# Patient Record
Sex: Female | Born: 1955 | Race: White | Hispanic: Yes | Marital: Married | Smoking: Never smoker
Health system: Southern US, Community
[De-identification: ages and names within clinical notes are randomized; demographics above are authoritative.]

## PROBLEM LIST (undated history)

## (undated) DIAGNOSIS — Z87442 Personal history of urinary calculi: Secondary | ICD-10-CM

## (undated) DIAGNOSIS — E039 Hypothyroidism, unspecified: Secondary | ICD-10-CM

## (undated) DIAGNOSIS — E785 Hyperlipidemia, unspecified: Secondary | ICD-10-CM

## (undated) DIAGNOSIS — R32 Unspecified urinary incontinence: Secondary | ICD-10-CM

## (undated) DIAGNOSIS — E079 Disorder of thyroid, unspecified: Secondary | ICD-10-CM

## (undated) HISTORY — DX: Disorder of thyroid, unspecified: E07.9

## (undated) HISTORY — DX: Hyperlipidemia, unspecified: E78.5

## (undated) HISTORY — PX: BUNIONECTOMY: SHX129

## (undated) HISTORY — DX: Unspecified urinary incontinence: R32

## (undated) HISTORY — PX: TONSILLECTOMY: SUR1361

---

## 1997-12-18 ENCOUNTER — Other Ambulatory Visit: Admission: RE | Admit: 1997-12-18 | Discharge: 1997-12-18 | Payer: Self-pay | Admitting: Obstetrics & Gynecology

## 1998-05-13 ENCOUNTER — Other Ambulatory Visit: Admission: RE | Admit: 1998-05-13 | Discharge: 1998-05-13 | Payer: Self-pay | Admitting: Obstetrics & Gynecology

## 1999-04-07 ENCOUNTER — Other Ambulatory Visit: Admission: RE | Admit: 1999-04-07 | Discharge: 1999-04-07 | Payer: Self-pay | Admitting: Obstetrics & Gynecology

## 2000-12-16 ENCOUNTER — Other Ambulatory Visit: Admission: RE | Admit: 2000-12-16 | Discharge: 2000-12-16 | Payer: Self-pay | Admitting: Obstetrics & Gynecology

## 2001-12-27 ENCOUNTER — Other Ambulatory Visit: Admission: RE | Admit: 2001-12-27 | Discharge: 2001-12-27 | Payer: Self-pay | Admitting: Obstetrics & Gynecology

## 2003-02-15 ENCOUNTER — Other Ambulatory Visit: Admission: RE | Admit: 2003-02-15 | Discharge: 2003-02-15 | Payer: Self-pay | Admitting: Obstetrics & Gynecology

## 2003-02-18 ENCOUNTER — Other Ambulatory Visit: Admission: RE | Admit: 2003-02-18 | Discharge: 2003-02-18 | Payer: Self-pay | Admitting: Obstetrics & Gynecology

## 2004-04-21 ENCOUNTER — Other Ambulatory Visit: Admission: RE | Admit: 2004-04-21 | Discharge: 2004-04-21 | Payer: Self-pay | Admitting: Obstetrics & Gynecology

## 2008-08-24 ENCOUNTER — Emergency Department (HOSPITAL_COMMUNITY): Admission: EM | Admit: 2008-08-24 | Discharge: 2008-08-24 | Payer: Self-pay | Admitting: Emergency Medicine

## 2008-12-10 ENCOUNTER — Encounter: Admission: RE | Admit: 2008-12-10 | Discharge: 2008-12-10 | Payer: Self-pay | Admitting: Obstetrics & Gynecology

## 2010-05-10 LAB — POCT I-STAT, CHEM 8
Chloride: 106 mEq/L (ref 96–112)
HCT: 44 % (ref 36.0–46.0)
Hemoglobin: 15 g/dL (ref 12.0–15.0)
Potassium: 4.1 mEq/L (ref 3.5–5.1)
Sodium: 141 mEq/L (ref 135–145)

## 2010-05-10 LAB — URINALYSIS, ROUTINE W REFLEX MICROSCOPIC
Bilirubin Urine: NEGATIVE
Glucose, UA: NEGATIVE mg/dL
Ketones, ur: NEGATIVE mg/dL
pH: 5.5 (ref 5.0–8.0)

## 2010-05-10 LAB — URINE MICROSCOPIC-ADD ON

## 2013-12-06 ENCOUNTER — Other Ambulatory Visit (HOSPITAL_COMMUNITY): Payer: Self-pay | Admitting: Obstetrics & Gynecology

## 2013-12-06 DIAGNOSIS — Z780 Asymptomatic menopausal state: Secondary | ICD-10-CM

## 2013-12-12 ENCOUNTER — Ambulatory Visit (HOSPITAL_COMMUNITY)
Admission: RE | Admit: 2013-12-12 | Discharge: 2013-12-12 | Disposition: A | Discharge status: Home / Self Care | Payer: BC Managed Care – PPO | Source: Ambulatory Visit | Attending: Obstetrics & Gynecology | Admitting: Obstetrics & Gynecology

## 2013-12-12 DIAGNOSIS — Z780 Asymptomatic menopausal state: Secondary | ICD-10-CM | POA: Diagnosis not present

## 2013-12-12 DIAGNOSIS — Z13820 Encounter for screening for osteoporosis: Secondary | ICD-10-CM | POA: Diagnosis present

## 2016-02-02 DIAGNOSIS — E785 Hyperlipidemia, unspecified: Secondary | ICD-10-CM

## 2016-02-02 HISTORY — DX: Hyperlipidemia, unspecified: E78.5

## 2016-08-11 ENCOUNTER — Telehealth: Payer: Self-pay | Admitting: Obstetrics and Gynecology

## 2016-08-11 NOTE — Telephone Encounter (Signed)
Called and left a message for patient to call back to schedule a new patient doctor referral for prolapse with Dr. Quincy Simmonds.

## 2016-08-13 ENCOUNTER — Encounter: Payer: Self-pay | Admitting: Obstetrics and Gynecology

## 2016-08-13 ENCOUNTER — Ambulatory Visit (INDEPENDENT_AMBULATORY_CARE_PROVIDER_SITE_OTHER): Payer: BC Managed Care – PPO | Admitting: Obstetrics and Gynecology

## 2016-08-13 VITALS — BP 138/72 | HR 72 | Resp 16 | Ht 61.75 in | Wt 179.0 lb

## 2016-08-13 DIAGNOSIS — N813 Complete uterovaginal prolapse: Secondary | ICD-10-CM

## 2016-08-13 DIAGNOSIS — N393 Stress incontinence (female) (male): Secondary | ICD-10-CM

## 2016-08-13 NOTE — Progress Notes (Signed)
GYNECOLOGY  VISIT   HPI: 61 y.o.   Married  Caucasian  female   G3P0003 with No LMP recorded. Patient is postmenopausal.   here for prolapse and pelvic floor.  Recently had some pink tinge twice with wiping.   Has personal [place] with Dr. Stann Mainland, and EMS was 3.6 mm.  No EMB was done.   Also dealing with prolapse.  Can feel it descending.   Prolapse interferes with voiding.   Incontinence is irritating to the skin.  Prolapse is making the leakage better.  Can leak with cough, sneeze, or laugh.   Can have fecal incontinence with a loose stool or cough.  No splinting to have BMs.  Sexually active and this is bothersome with intercourse.   Hx urethral dilation for UTIs.   Hx cystitis as an adult.  No pyelonephritis.  No hematuria unless had infection.  Hx renal stone.  Passed spontaneously.   Night time urination once per night.  DF - every 2 hours.   Did PT in the past.   3 vaginal deliveries.  Largest was 11 pounds.  One forceps and one vacuum.   GYNECOLOGIC HISTORY: No LMP recorded. Patient is postmenopausal. Contraception:  Postmenopausal/condoms Menopausal hormone therapy:  none Last mammogram:  09/01/2015 done at Dr. Gwynne Edinger office Last pap smear:   09/01/15 -- WNL        OB History    Gravida Para Term Preterm AB Living   3 3 0 0 0 3   SAB TAB Ectopic Multiple Live Births   0 0 0 0 0         There are no active problems to display for this patient.   Past Medical History:  Diagnosis Date  . Thyroid disease   . Urinary incontinence     Past Surgical History:  Procedure Laterality Date  . BUNIONECTOMY    . TONSILLECTOMY      Current Outpatient Prescriptions  Medication Sig Dispense Refill  . CALCIUM PO Take by mouth.    . dicyclomine (BENTYL) 20 MG tablet as needed.    Marland Kitchen levothyroxine (SYNTHROID) 75 MCG tablet Take 1 tablet by mouth daily.    . Multiple Vitamin (MULTIVITAMIN) tablet Take 1 tablet by mouth daily.     No current  facility-administered medications for this visit.      ALLERGIES: Nsaids  Family History  Problem Relation Age of Onset  . Multiple sclerosis Mother   . Liver cancer Maternal Grandmother   . Heart attack Maternal Grandfather   . Hypertension Maternal Grandfather     Social History   Social History  . Marital status: Married    Spouse name: N/A  . Number of children: N/A  . Years of education: N/A   Occupational History  . Not on file.   Social History Main Topics  . Smoking status: Never Smoker  . Smokeless tobacco: Never Used  . Alcohol use 0.6 oz/week    1 Glasses of wine per week     Comment: occasional  . Drug use: No  . Sexual activity: Yes    Birth control/ protection: Post-menopausal, Condom   Other Topics Concern  . Not on file   Social History Narrative  . No narrative on file    ROS:  Pertinent items are noted in HPI.  PHYSICAL EXAMINATION:    BP 138/72 (BP Location: Right Arm, Patient Position: Sitting, Cuff Size: Normal)   Pulse 72   Resp 16   Ht 5' 1.75" (1.568  m)   Wt 179 lb (81.2 kg)   BMI 33.01 kg/m     General appearance: alert, cooperative and appears stated age Head: Normocephalic, without obvious abnormality, atraumatic Lungs: clear to auscultation bilaterally Heart: regular rate and rhythm Abdomen: soft, non-tender, no masses,  no organomegaly Extremities: extremities normal, atraumatic, no cyanosis or edema No abnormal inguinal nodes palpated Neurologic: Grossly normal  Pelvic: External genitalia:  no lesions              Urethra:  normal appearing urethra with no masses, tenderness or lesions              Bartholins and Skenes: normal                 Vagina: normal appearing vagina with normal color and discharge, no lesions.  Third degree uterine prolapse.  First degree cystocele.  First degree rectocele.               Cervix: no lesions                Bimanual Exam:  Uterus:  normal size, contour, position, consistency,  mobility, non-tender              Adnexa: no mass, fullness, tenderness              Rectal exam: Yes.  .  Confirms.              Anus:  normal sphincter tone, no lesions.  Good tone.   Chaperone was present for exam.  ASSESSMENT  Third degree uterine prolapse.  Stress incontinence.  PLAN  Comprehensive discussion of prolapse and urinary incontinence - etiologies and treatment options.  We discussed observational management, pessary care, physical therapy, and surgical management.  I reviewed that surgery is best approached comprehensively with the amount of prolapse she has.  I would recommend a hysterectomy and vaginal apical prolapse repair, which can be performed vaginally, abdominally, or laparoscopically.  She will also likely need an anterior and posterior colporrhaphy with anti-incontinence procedure.  Urodynamic testing with reduction of the prolapse is recommended presurgery.  Will schedule this.  ACOG handouts on prolapse and incontinence and surgical care for these conditions were provided to the patient.  Patient wishes to proceed forward with surgical care including a sacrocolpopexy likely robotically.  Patient is aware that I currently perform this procedure through an open incision and that I am beginning to perform it laparoscopically with the robot.   An After Visit Summary was printed and given to the patient.  ___30___ minutes face to face time of which over 50% was spent in counseling.

## 2016-08-23 ENCOUNTER — Telehealth: Payer: Self-pay | Admitting: Obstetrics and Gynecology

## 2016-08-23 NOTE — Telephone Encounter (Signed)
Patient is asking ready to schedule her procedure.

## 2016-08-23 NOTE — Telephone Encounter (Signed)
Return call to patient regarding scheduling urodynamics testing. Left message to call back.

## 2016-08-23 NOTE — Telephone Encounter (Signed)
Spoke with patient, requesting to schedule Urodynamics testing. States she spoke with [place] last week. Would also like to know how far out after Urodynamics testing will surgery be scheduled for? Advised patient would forward message to [facility] who will assist with scheduling and additional questions in regards to surgery. Patient verbalizes understanding and is agreeable.  Routing to Lamont Snowball, Therapist, sports.  Cc: Dr. Quincy Simmonds

## 2016-08-24 NOTE — Telephone Encounter (Signed)
Patient returning your call.

## 2016-08-24 NOTE — Telephone Encounter (Signed)
Spoke with patient. Urodynamics bladder testing form reviewed with patient. Urine check scheduled for 09/03/2016 at 2:30 pm. Urodynamics scheduled for 09/08/2016 at 2 pm. Follow up with Dr.Silva scheduled for 09/15/16 at 2:30 pm. Patient is agreeable to all appointment dates and times. Aware she will need to arrive to urodynamics appointment with a full bladder. Urodynamics bladder testing form mailed to patient's verified address on file.  Cc: Lamont Snowball, RN  Routing to provider for final review. Patient agreeable to disposition. Will close encounter.

## 2016-08-24 NOTE — Telephone Encounter (Signed)
Return call to patient. Left message to call back. Can speak to Triage nurse regarding scheduling. Next available date 09-08-16 at 930am  or 2pm.

## 2016-08-24 NOTE — Telephone Encounter (Signed)
Patient is returning a call top Gay Filler. Please use the home phone number.

## 2016-09-03 ENCOUNTER — Ambulatory Visit (INDEPENDENT_AMBULATORY_CARE_PROVIDER_SITE_OTHER): Payer: BC Managed Care – PPO

## 2016-09-03 VITALS — BP 138/70 | HR 64 | Resp 16 | Wt 179.0 lb

## 2016-09-03 DIAGNOSIS — R8290 Unspecified abnormal findings in urine: Secondary | ICD-10-CM | POA: Diagnosis not present

## 2016-09-03 DIAGNOSIS — Z01812 Encounter for preprocedural laboratory examination: Secondary | ICD-10-CM | POA: Diagnosis not present

## 2016-09-03 LAB — POCT URINALYSIS DIPSTICK
BILIRUBIN UA: NEGATIVE
GLUCOSE UA: NEGATIVE
Ketones, UA: NEGATIVE
NITRITE UA: NEGATIVE
PH UA: 5 (ref 5.0–8.0)
Protein, UA: NEGATIVE
UROBILINOGEN UA: 0.2 U/dL

## 2016-09-03 NOTE — Progress Notes (Signed)
Patient in office for a urine dipstick for Urodynamics. Urine sample given and tested. Dipstick showed a trace of RBC and 3+ WBC. Per patient has not had any symptoms. Advised patient that urine will be sent for further analysis (urine culture) and someone from our office will contact her Monday. Culture drawn up and sent to lab.  Routing to provider for final review. Patient agreeable to disposition. Will close encounter.

## 2016-09-05 LAB — URINE CULTURE

## 2016-09-06 ENCOUNTER — Telehealth: Payer: Self-pay | Admitting: Obstetrics and Gynecology

## 2016-09-06 ENCOUNTER — Telehealth: Payer: Self-pay

## 2016-09-06 MED ORDER — FLUCONAZOLE 150 MG PO TABS: 150.0000 mg | ORAL_TABLET | Freq: Once | ORAL | 0 refills | Status: AC

## 2016-09-06 MED ORDER — AMPICILLIN 500 MG PO CAPS: 500.0000 mg | ORAL_CAPSULE | Freq: Four times a day (QID) | ORAL | 0 refills | Status: DC

## 2016-09-06 NOTE — Telephone Encounter (Signed)
Spoke with patient and notified of message below. Rxs sent for Ampicilin 500mg  #28NR, 1po qid and Diflucan 150mg  #2NR, 1po prn yeast and may repeat in 72 hours. Will have Gay Filler call to reschedule urodynamic testing on 09-08-16.

## 2016-09-06 NOTE — Telephone Encounter (Signed)
Patient called requesting to reschedule her urinalysis and urodynamics appointments. Appointments cancelled and routing to [place], per [place].

## 2016-09-06 NOTE — Telephone Encounter (Signed)
-----   Message from Nunzio Cobbs, MD sent at 09/05/2016 11:05 AM EDT ----- Please report final urine culture showing group B strep. I am recommending treatment with ampicillin 500 mg po qid for 7 days.  Dispense:  28, RF none.  She may also have Dilfucan 150 gm po x 1 prn yeast infection.  May repeat in 72 hours if needed.   Dispense:  2, RF none.  She will need to have her urodynamic testing rescheduled.  Cc- Lamont Snowball

## 2016-09-07 NOTE — Telephone Encounter (Signed)
Call to patient. Urodynamic testing rescheduled to 09-22-16 pending urine TOC on 09-16-16.  Patient very anxious to plan surgery date. Date options discussed. Tentative plan for 10-19-16 at [facility] hospital. Patient advised this is pending urodynamic testing results and consult with Dr Quincy Simmonds.  Patient advised Dr Quincy Simmonds will review call and confirm if this is acceptable option and we will call her back.   10-19-16/ Women's/robot available/[facility] assist/no time limitation. Please advise.

## 2016-09-08 ENCOUNTER — Ambulatory Visit: Payer: BC Managed Care – PPO

## 2016-09-08 ENCOUNTER — Telehealth: Payer: Self-pay | Admitting: Obstetrics and Gynecology

## 2016-09-08 NOTE — Telephone Encounter (Signed)
I will need to do some coordination of the date for the surgery if it is to be done robotically.  If it is to be done through laparotomy, I can confirm this date of 10/19/16.

## 2016-09-08 NOTE — Telephone Encounter (Signed)
Great question. The ampicillin is a very specific antibiotic for the type of bacterial organism which grew in her urine.  This is a proper antibiotic for group B streptococcus organisms.

## 2016-09-08 NOTE — Telephone Encounter (Signed)
Patient called requesting to speak with the nurse about the medication she is taking for a urinary tract infection. She wants to be sure we are coordinating her with Dr. Stann Mainland who referred her. She said he's tried several different medications for her urinary tract infections in the past and she wants to be sure she is on the right medication.

## 2016-09-08 NOTE — Telephone Encounter (Signed)
Dr. Quincy Simmonds, see patient message below and advise.

## 2016-09-09 NOTE — Telephone Encounter (Signed)
Spoke with patient, advised as seen below per Dr. Quincy Simmonds. Patient thankful for f/u and clarification, verbalizes understanding and is agreeable.  Patient is agreeable to disposition. Will close encounter.

## 2016-09-10 NOTE — Telephone Encounter (Signed)
Surgical options to be discussed at appointment scheduled for 09-24-16. Encounter closed.

## 2016-09-15 ENCOUNTER — Ambulatory Visit: Payer: BC Managed Care – PPO | Admitting: Obstetrics and Gynecology

## 2016-09-16 ENCOUNTER — Ambulatory Visit (INDEPENDENT_AMBULATORY_CARE_PROVIDER_SITE_OTHER): Payer: BC Managed Care – PPO | Admitting: Obstetrics and Gynecology

## 2016-09-16 VITALS — BP 136/80 | HR 76 | Resp 18 | Ht 61.75 in | Wt 179.0 lb

## 2016-09-16 DIAGNOSIS — R3989 Other symptoms and signs involving the genitourinary system: Secondary | ICD-10-CM | POA: Diagnosis not present

## 2016-09-16 DIAGNOSIS — R82998 Other abnormal findings in urine: Secondary | ICD-10-CM

## 2016-09-16 DIAGNOSIS — N393 Stress incontinence (female) (male): Secondary | ICD-10-CM

## 2016-09-16 DIAGNOSIS — R8299 Other abnormal findings in urine: Secondary | ICD-10-CM

## 2016-09-16 LAB — POCT URINALYSIS DIPSTICK
BILIRUBIN UA: NEGATIVE
Glucose, UA: NEGATIVE
Ketones, UA: NEGATIVE
NITRITE UA: NEGATIVE
PH UA: 5 (ref 5.0–8.0)
Protein, UA: NEGATIVE
RBC UA: NEGATIVE
UROBILINOGEN UA: 0.2 U/dL

## 2016-09-16 MED ORDER — SULFAMETHOXAZOLE-TRIMETHOPRIM 800-160 MG PO TABS: 1.0000 | ORAL_TABLET | Freq: Two times a day (BID) | ORAL | 0 refills | Status: DC

## 2016-09-16 NOTE — Progress Notes (Signed)
GYNECOLOGY  VISIT   HPI: 61 y.o.   Married  Caucasian  female   G3P0003 with No LMP recorded. Patient is postmenopausal.   here for   Urine recheck prior to urodynamics.   She has stress incontinence and complete uterovaginal prophylaxis.   Feels bladder discomfort and wants treatment.  No dysuria.  No fevers, nausea, vomiting or back pain.   Little discharge and no itching.  Took Diflucan x 2.   Drinking cranberry juice and water to hydrate.   Recent UC on 09/03/16 > 100,000 GBS treated with Ampicillin 500 mg po qid x 5 days.  States hx of UTIs.   Urine dip - 2+ WBCs.  GYNECOLOGIC HISTORY: No LMP recorded. Patient is postmenopausal. Contraception:  Post menopausal  Menopausal hormone therapy:  none Last mammogram:  09/01/15  Last pap smear:   09/01/15 Neg         OB History    Gravida Para Term Preterm AB Living   3 3 0 0 0 3   SAB TAB Ectopic Multiple Live Births   0 0 0 0 0         There are no active problems to display for this patient.   Past Medical History:  Diagnosis Date  . Thyroid disease   . Urinary incontinence     Past Surgical History:  Procedure Laterality Date  . BUNIONECTOMY    . TONSILLECTOMY      Current Outpatient Prescriptions  Medication Sig Dispense Refill  . ampicillin (PRINCIPEN) 500 MG capsule Take 1 capsule (500 mg total) by mouth 4 (four) times daily. 28 capsule 0  . CALCIUM PO Take by mouth.    . dicyclomine (BENTYL) 20 MG tablet as needed.    Marland Kitchen levothyroxine (SYNTHROID) 75 MCG tablet Take 1 tablet by mouth daily.    . Multiple Vitamin (MULTIVITAMIN) tablet Take 1 tablet by mouth daily.     No current facility-administered medications for this visit.      ALLERGIES: Nsaids  Family History  Problem Relation Age of Onset  . Multiple sclerosis Mother   . Liver cancer Maternal Grandmother   . Heart attack Maternal Grandfather   . Hypertension Maternal Grandfather     Social History   Social History  . Marital status:  Married    Spouse name: N/A  . Number of children: N/A  . Years of education: N/A   Occupational History  . Not on file.   Social History Main Topics  . Smoking status: Never Smoker  . Smokeless tobacco: Never Used  . Alcohol use 0.6 oz/week    1 Glasses of wine per week     Comment: occasional  . Drug use: No  . Sexual activity: Yes    Birth control/ protection: Post-menopausal, Condom   Other Topics Concern  . Not on file   Social History Narrative  . No narrative on file    ROS:  Pertinent items are noted in HPI.  PHYSICAL EXAMINATION:    BP 136/80 (BP Location: Right Arm, Patient Position: Sitting, Cuff Size: Large)   Pulse 76   Resp 18   Ht 5' 1.75" (1.568 m)   Wt 179 lb (81.2 kg)   BMI 33.01 kg/m     General appearance: alert, cooperative and appears stated age  ASSESSMENT  Recent GBS UTI.  Bladder discomfort.  Abnormal urine. Complete uterovaginal prolapse. Stress incontinence.   PLAN  Urine cx sent.  Start Bactrim DS po bid x 1 week.  Patient may need to have prophylaxis of abs to complete her testing.  Will not cancel urodynamics until after her final UC is back.    An After Visit Summary was printed and given to the patient.  __15___ minutes face to face time of which over 50% was spent in counseling.

## 2016-09-16 NOTE — Patient Instructions (Signed)

## 2016-09-16 NOTE — Progress Notes (Signed)
Patient in for UA prior urodynamics. Patient states she is having increased urinary frequency and some discharge. She would like to get abx before the weekend bc she is going on vacation.   UA: WBC=Moderate (++)

## 2016-09-17 LAB — URINALYSIS, MICROSCOPIC ONLY: Casts: NONE SEEN /lpf

## 2016-09-18 ENCOUNTER — Encounter: Payer: Self-pay | Admitting: Obstetrics and Gynecology

## 2016-09-18 LAB — URINE CULTURE

## 2016-09-20 ENCOUNTER — Telehealth: Payer: Self-pay | Admitting: Obstetrics and Gynecology

## 2016-09-20 MED ORDER — CEPHALEXIN 500 MG PO CAPS: 500.0000 mg | ORAL_CAPSULE | Freq: Two times a day (BID) | ORAL | 0 refills | Status: DC

## 2016-09-20 NOTE — Telephone Encounter (Signed)
Patient being treated for UTI and still having symptoms.  States she has an appt on Wednesday

## 2016-09-20 NOTE — Telephone Encounter (Signed)
Spoke with patient. Advised of message as seen below from [place]. Patient verbalizes understanding. States she is having burning with urination and blood in her urine. Advised she will need to switch to taking Keflex 500 mg BID x 7 days. Patient is agreeable. Rx sent to pharmacy on file. Requesting TOC. Appointment scheduled for 09/30/16 at 8:30 am. Advised she will be contacted upon Encarnacion Chu return to office to reschedule urodynamics. Patient is agreeable.  Notes recorded by Nunzio Cobbs, MD on 09/19/2016 at 7:13 PM EDT Please contact the patient to see how she is doing.  She reported bladder discomfort but not dysuria when I saw her in the office for her pre-urodynamic testing.  She had a recent GBS UTI that I treated with Ampicillin.  This time, I treated with Bactrim while awaiting this culture result which shows a small amount of Group B Strep. If she is having any discomfort, I would like to treat her with Keflex 500 mg po bid for one week.  If her urine culture then becomes negative, I would like to continue her on daily prophylaxis of Keflex 250 mg daily. This may be necessary until surgery is done.  Her urodynamics will need to be rescheduled.  CC: Lamont Snowball, RN  Routing to provider for final review. Patient agreeable to disposition. Will close encounter.

## 2016-09-22 ENCOUNTER — Ambulatory Visit: Payer: BC Managed Care – PPO

## 2016-09-24 ENCOUNTER — Ambulatory Visit: Payer: BC Managed Care – PPO | Admitting: Obstetrics and Gynecology

## 2016-09-30 ENCOUNTER — Ambulatory Visit (INDEPENDENT_AMBULATORY_CARE_PROVIDER_SITE_OTHER): Payer: BC Managed Care – PPO | Admitting: *Deleted

## 2016-09-30 VITALS — BP 140/86 | HR 76 | Resp 16 | Ht 61.75 in | Wt 179.0 lb

## 2016-09-30 DIAGNOSIS — N390 Urinary tract infection, site not specified: Secondary | ICD-10-CM | POA: Diagnosis not present

## 2016-09-30 DIAGNOSIS — R319 Hematuria, unspecified: Secondary | ICD-10-CM | POA: Diagnosis not present

## 2016-09-30 NOTE — Progress Notes (Signed)
Patient in today for UA TOC. Patient states she feels much better and denies any symptoms.  Informed patient we will call her with UC results and advise about urodynamics then.  UC sent.  Routed to Dr. Quincy Simmonds for review Encounter closed.

## 2016-10-04 ENCOUNTER — Telehealth: Payer: Self-pay | Admitting: Obstetrics and Gynecology

## 2016-10-04 LAB — URINE CULTURE

## 2016-10-04 NOTE — Telephone Encounter (Signed)
Left message to return to call to my personal cell phone as it is Labor Day.  No details left.   Patient has a [place] UTI that needs treatment.  It is sensitive to Bactrim, Macrobid, and Cipro.  She has uterovaginal prolapse and is having recurrent bacteria in her urine.   I am recommending Macrobid 100 mg po bid for one week, and then immediate Macrobid 100 mg po q hs for prophylaxis.  Any further urine cultures need to be done with a catheter specimen of urine.   We are trying to get her urodynamic testing done when her bladder is clear of infection.

## 2016-10-05 MED ORDER — NITROFURANTOIN MONOHYD MACRO 100 MG PO CAPS: ORAL_CAPSULE | ORAL | 0 refills | Status: DC

## 2016-10-05 NOTE — Telephone Encounter (Signed)
Spoke with patient, advised as seen below per Dr. Quincy Simmonds. Rx for Macrobid #35/0RF to verified pharmacy on file. Scheduled for f/u OV with Dr. Quincy Simmonds on 10/19/16 at 1pm. Patient verbalizes understanding and is agreeable.  Patient is agreeable to disposition. Will close encounter.   Cc: Lamont Snowball, RN

## 2016-10-05 NOTE — Telephone Encounter (Signed)
Left message to call Andrell Tallman at [phone].  

## 2016-10-05 NOTE — Telephone Encounter (Signed)
Patient returned call requesting to speak with the nurse.  [facility] on [address]

## 2016-10-13 ENCOUNTER — Telehealth: Payer: Self-pay | Admitting: Obstetrics and Gynecology

## 2016-10-13 NOTE — Telephone Encounter (Signed)
Spoke with patient. Advised to keep appointment with Dr. Quincy Simmonds for 9/18, will discuss plan further at this OV. Urodynamics scheduling will likely depend on urine culture results. Tentatively planning 10/3 for next  urodynamics, again depending on results and recommendations per Dr. Quincy Simmonds. Advised patient Dr. Quincy Simmonds is out of the office, will review with covering provider and return call with any additional recommendations.   Patient would like to discuss recovery time given surgery will be at a later date than initially planned. Has some upcoming commitments that she is concerned may interfere with recovery.   Routing to covering provider for final review. Patient is agreeable to disposition. Will close encounter.   Cc: Dr. Quincy Simmonds

## 2016-10-13 NOTE — Telephone Encounter (Signed)
I'm not sure exactly what the surgical plan is. Will need to defer the answers to the patient's questions to when Dr Quincy Simmonds returns. Will cc the chart to Lorella Nimrod, RN to see if she can provide the patient with some more information.

## 2016-10-13 NOTE — Telephone Encounter (Signed)
Patient is coming in for urine toc on the 18th and want to know if she can schedule urodynamics for the following week.

## 2016-10-14 ENCOUNTER — Telehealth: Payer: Self-pay | Admitting: Obstetrics and Gynecology

## 2016-10-14 ENCOUNTER — Encounter: Payer: Self-pay | Admitting: Obstetrics and Gynecology

## 2016-10-14 ENCOUNTER — Ambulatory Visit (INDEPENDENT_AMBULATORY_CARE_PROVIDER_SITE_OTHER): Payer: BC Managed Care – PPO | Admitting: Obstetrics and Gynecology

## 2016-10-14 VITALS — BP 130/78 | HR 68 | Resp 16 | Wt 179.0 lb

## 2016-10-14 DIAGNOSIS — Z87440 Personal history of urinary (tract) infections: Secondary | ICD-10-CM | POA: Diagnosis not present

## 2016-10-14 DIAGNOSIS — N760 Acute vaginitis: Secondary | ICD-10-CM

## 2016-10-14 LAB — POCT URINALYSIS DIPSTICK
BILIRUBIN UA: NEGATIVE
GLUCOSE UA: NEGATIVE
KETONES UA: NEGATIVE
NITRITE UA: NEGATIVE
PH UA: 6 (ref 5.0–8.0)
Protein, UA: NEGATIVE
Urobilinogen, UA: NEGATIVE E.U./dL — AB

## 2016-10-14 NOTE — Telephone Encounter (Signed)
Patient thinks she has a yeast infection and is requesting something be called into her pharmacy.  I attempted to schedule patient an appointment and she declined.

## 2016-10-14 NOTE — Telephone Encounter (Signed)
Spoke with patient. Patient reports green vaginal discharge and itching, requesting RX for yeast. Current taking Macrobid nightly for prophylaxis, scheduled for repeat urine culture on 9/18. Advised patient OV needed for further evaluation.  Advise patient Dr. Quincy Simmonds is out of the office, can schedule with covering provider. Patient initially declined OV for today d/t another appointment. Advised patient our office will be closed tomorrow d/t weather. Patient scheduled for OV today at 10am with Dr. Talbert Nan. Patient verbalizes understanding and is agreeable.   Routing to covering provider for final review. Patient is agreeable to disposition. Will close encounter.  Cc: Dr. Quincy Simmonds

## 2016-10-14 NOTE — Progress Notes (Signed)
GYNECOLOGY  VISIT   HPI: 61 y.o.   Married  Caucasian  female   G3P0003 with No LMP recorded. Patient is postmenopausal.   here for vaginal itching and discharge X 2 days . She is seeing Dr Quincy Simmonds for prolapse, she has had a difficult to treat UTI. She has taken several different antibiotics. She is currently on prophylactic macrobid. She feels her urinary symptoms are better. This morning she noticed some green discharge, mild itching.   GYNECOLOGIC HISTORY: No LMP recorded. Patient is postmenopausal. Contraception: post menopausal  Menopausal hormone therapy: none        OB History    Gravida Para Term Preterm AB Living   3 3 0 0 0 3   SAB TAB Ectopic Multiple Live Births   0 0 0 0 0         There are no active problems to display for this patient.   Past Medical History:  Diagnosis Date  . Thyroid disease   . Urinary incontinence     Past Surgical History:  Procedure Laterality Date  . BUNIONECTOMY    . TONSILLECTOMY      Current Outpatient Prescriptions  Medication Sig Dispense Refill  . CALCIUM PO Take by mouth.    . dicyclomine (BENTYL) 20 MG tablet as needed.    Marland Kitchen levothyroxine (SYNTHROID) 75 MCG tablet Take 1 tablet by mouth daily.    . Multiple Vitamin (MULTIVITAMIN) tablet Take 1 tablet by mouth daily.     No current facility-administered medications for this visit.      ALLERGIES: Nsaids  Family History  Problem Relation Age of Onset  . Multiple sclerosis Mother   . Liver cancer Maternal Grandmother   . Heart attack Maternal Grandfather   . Hypertension Maternal Grandfather     Social History   Social History  . Marital status: Married    Spouse name: N/A  . Number of children: N/A  . Years of education: N/A   Occupational History  . Not on file.   Social History Main Topics  . Smoking status: Never Smoker  . Smokeless tobacco: Never Used  . Alcohol use 0.6 oz/week    1 Glasses of wine per week     Comment: occasional  . Drug use: No   . Sexual activity: Yes    Birth control/ protection: Post-menopausal, Condom   Other Topics Concern  . Not on file   Social History Narrative  . No narrative on file    Review of Systems  Constitutional: Negative.   HENT: Negative.   Eyes: Negative.   Respiratory: Negative.   Cardiovascular: Negative.   Gastrointestinal: Negative.   Genitourinary:       Vaginal itching and discharge   Musculoskeletal: Negative.   Skin: Negative.   Neurological: Negative.   Endo/Heme/Allergies: Negative.   Psychiatric/Behavioral: Negative.     PHYSICAL EXAMINATION:    BP 130/78 (BP Location: Right Arm, Patient Position: Sitting, Cuff Size: Normal)   Pulse 68   Resp 16   Wt 179 lb (81.2 kg)   BMI 33.01 kg/m     General appearance: alert, cooperative and appears stated age  Pelvic: External genitalia:  no lesions              Urethra:  normal appearing urethra with no masses, tenderness or lesions              Bartholins and Skenes: normal  Vagina: normal appearing vagina with normal color and discharge, no lesions              Cervix: no lesions, at introitus.  Chaperone was present for exam.  Wet prep: ? clue, no trich, few wbc KOH: no yeast PH: 5  Urine dip: small leuk, trace blood  ASSESSMENT Mild vaginitis symptoms, tolerable Recurrent UTI's on antibiotics, on suppression. Currently feeling better. She has an appointment for a straight cath ua with Dr Quincy Simmonds for next week    PLAN Send vaginitis panel Send ccua for ua, c&s, if negative for infection can forgo the catheterization next week Will use Vaseline externally as needed    An After Visit Summary was printed and given to the patient.   CC: Dr Quincy Simmonds

## 2016-10-15 LAB — VAGINITIS/VAGINOSIS, DNA PROBE
Candida Species: NEGATIVE
Gardnerella vaginalis: NEGATIVE
Trichomonas vaginosis: NEGATIVE

## 2016-10-15 LAB — URINALYSIS, MICROSCOPIC ONLY: CASTS: NONE SEEN /LPF

## 2016-10-16 LAB — URINE CULTURE

## 2016-10-18 ENCOUNTER — Telehealth: Payer: Self-pay | Admitting: *Deleted

## 2016-10-18 NOTE — Telephone Encounter (Signed)
-----   Message from Salvadore Dom, MD sent at 10/18/2016  7:36 AM EDT ----- This patient has recurrent UTI's. When she was seen 4 days ago, she was on prophylactic macrobid and was not symptomatic. This could be a contaminant. She was due to come in for a straight cath ua with Dr Quincy Simmonds (I think tomorrow). I would have her come in for the straight cath and hold on treating as long as she is still asymptomatic.  CC: Dr Quincy Simmonds

## 2016-10-18 NOTE — Telephone Encounter (Signed)
Spoke with patient and gave recommendations- patient will keep appointment for tomorrow with Dr. Quincy Simmonds -eh

## 2016-10-18 NOTE — Telephone Encounter (Signed)
Left message to call regarding results -eh 

## 2016-10-19 ENCOUNTER — Ambulatory Visit (INDEPENDENT_AMBULATORY_CARE_PROVIDER_SITE_OTHER): Payer: BC Managed Care – PPO | Admitting: Obstetrics and Gynecology

## 2016-10-19 ENCOUNTER — Encounter: Payer: Self-pay | Admitting: Obstetrics and Gynecology

## 2016-10-19 VITALS — BP 130/72 | HR 80 | Resp 16 | Wt 179.0 lb

## 2016-10-19 DIAGNOSIS — N390 Urinary tract infection, site not specified: Secondary | ICD-10-CM

## 2016-10-19 DIAGNOSIS — N813 Complete uterovaginal prolapse: Secondary | ICD-10-CM | POA: Diagnosis not present

## 2016-10-19 DIAGNOSIS — R8290 Unspecified abnormal findings in urine: Secondary | ICD-10-CM | POA: Diagnosis not present

## 2016-10-19 MED ORDER — NITROFURANTOIN MONOHYD MACRO 100 MG PO CAPS: 100.0000 mg | ORAL_CAPSULE | Freq: Every day | ORAL | 0 refills | Status: DC

## 2016-10-19 NOTE — Progress Notes (Addendum)
GYNECOLOGY  VISIT   HPI: 61 y.o.   Married  Caucasian  female   G3P0003 with No LMP recorded. Patient is postmenopausal.   here for TOC UTI.   Reporting urgency today.   Patient is currently on Macrobid 100 mg po daily for prophylaxis.  Has about 2 weeks worth left.  Last UC 10/14/16 showed 10,000 - 25, 000 GBS.  Prior UC 09/30/16 showed 25,000 - 50,000 Klebsiella and enterococcus 50,000 - 100,000.  Both were sensitive to and treated with Macrobid.   Patient has complete uterovaginal prolapse and is awaiting a negative UC to do urodynamic testing with reduction of her prolapse.   She is planning on future surgery for prolapse and stress incontinence.   Her urinary incontinence occurred prior to progression of the prolapse and has gotten better with increased prolapse.   Has some potential work responsibility in December.   Urine dip today - negative.   GYNECOLOGIC HISTORY: No LMP recorded. Patient is postmenopausal. Contraception:  Postmenopausal Menopausal hormone therapy:  none Last mammogram: will schedule with Dr. Stann Mainland Last pap smear:   09/01/15 Neg         OB History    Gravida Para Term Preterm AB Living   3 3 0 0 0 3   SAB TAB Ectopic Multiple Live Births   0 0 0 0 0         There are no active problems to display for this patient.   Past Medical History:  Diagnosis Date  . Thyroid disease   . Urinary incontinence     Past Surgical History:  Procedure Laterality Date  . BUNIONECTOMY    . TONSILLECTOMY      Current Outpatient Prescriptions  Medication Sig Dispense Refill  . CALCIUM PO Take by mouth.    . CRANBERRY PO Take 2 tablets by mouth daily.    Marland Kitchen dicyclomine (BENTYL) 20 MG tablet as needed.    Marland Kitchen levothyroxine (SYNTHROID) 75 MCG tablet Take 1 tablet by mouth daily.    . Multiple Vitamin (MULTIVITAMIN) tablet Take 1 tablet by mouth daily.    . nitrofurantoin, macrocrystal-monohydrate, (MACROBID) 100 MG capsule Take 100 mg by mouth daily.     No  current facility-administered medications for this visit.      ALLERGIES: Nsaids  Family History  Problem Relation Age of Onset  . Multiple sclerosis Mother   . Liver cancer Maternal Grandmother   . Heart attack Maternal Grandfather   . Hypertension Maternal Grandfather     Social History   Social History  . Marital status: Married    Spouse name: N/A  . Number of children: N/A  . Years of education: N/A   Occupational History  . Not on file.   Social History Main Topics  . Smoking status: Never Smoker  . Smokeless tobacco: Never Used  . Alcohol use 0.6 oz/week    1 Glasses of wine per week     Comment: occasional  . Drug use: No  . Sexual activity: Yes    Birth control/ protection: Post-menopausal, Condom   Other Topics Concern  . Not on file   Social History Narrative  . No narrative on file    ROS:  Pertinent items are noted in HPI.  PHYSICAL EXAMINATION:    BP 130/72 (BP Location: Right Arm, Patient Position: Sitting, Cuff Size: Large)   Pulse 80   Resp 16   Wt 179 lb (81.2 kg)   BMI 33.01 kg/m  General appearance: alert, cooperative and appears stated age   Pelvic: External genitalia:  no lesions              Urethra:  normal appearing urethra with no masses, tenderness or lesions              Bartholins and Skenes: normal                 Vagina: normal appearing vagina with normal color and discharge, no lesions.  Complete uterovaginal prolapse.  Having mucousy discharge.              Cervix: no lesions                Bimanual Exam:  Uterus:  normal size, contour, position, consistency, mobility, non-tender              Adnexa: no mass, fullness, tenderness               Chaperone was present for exam.  ASSESSMENT  Complete uterovaginal prolapse.  Hx recurrent UTIs.  Hx stress incontinence.   PLAN  Follow up cath urine culture.  Continue Macrobid 100 mg daily for prophylaxis. She will do annual exam with Dr. Stann Mainland.  Follow up here  for pelvic ultrasound and urodynamics.  I would like to evaluate her ovaries prior to surgery.  We talked about abdominal versus robotic laparoscopic sacrocolpopexy.  An After Visit Summary was printed and given to the patient.  __25____ minutes face to face time of which over 50% was spent in counseling.

## 2016-10-20 LAB — URINALYSIS, MICROSCOPIC ONLY
Casts: NONE SEEN /lpf
EPITHELIAL CELLS (NON RENAL): NONE SEEN /HPF (ref 0–10)

## 2016-10-20 LAB — URINE CULTURE: Organism ID, Bacteria: NO GROWTH

## 2016-10-25 ENCOUNTER — Ambulatory Visit (INDEPENDENT_AMBULATORY_CARE_PROVIDER_SITE_OTHER): Payer: BC Managed Care – PPO | Admitting: *Deleted

## 2016-10-25 VITALS — BP 124/72 | HR 68 | Resp 14 | Wt 181.0 lb

## 2016-10-25 DIAGNOSIS — N813 Complete uterovaginal prolapse: Secondary | ICD-10-CM | POA: Diagnosis not present

## 2016-10-25 DIAGNOSIS — N393 Stress incontinence (female) (male): Secondary | ICD-10-CM

## 2016-10-25 NOTE — Progress Notes (Signed)
Kellie Case is a 61 y.o. female Who presents today for urodynamics testing, ordered by Dr. Quincy Simmonds.   Allergies and medications reviewed.  Denies complaints today. No urinary complaints.   Urine Micro exam: negative for WBC's or RBC's, okay to proceed per Dr. Quincy Simmonds.  Patient reports urinary leakage with coughing, sneezing, exercise.   Urodynamics testing initiated. #3 ring pessary placed. Lumax Bladder Catheter #10 [place] and lumax Abdominal Catheter #10 [place].   Post void residual 350 ml.   Urethral catheter placed without issue. Rectal catheter placed without issue.  Catheters removed and pessary removed.  Urodynamics testing completed. Please see scanned Patient summary report in Epic. Procedure completed and patient tolerated well without complaints. Patient scheduled for follow up office visit with Dr. Quincy Simmonds to discuss results. Patient agreeable.   Patient given post procedure instructions:  You may have a mild bladder and rectal discomfort for a few hours after the test. You may experience some frequent urination and slight burning the first few times you urinate after the test. Rarely, the urine may be blood tinged. These are both due to catheter placements and resolve quickly. You should call our office immediately if you have signs of infection, which may include bladder pain, urinary urgency, fever, or burning during urination. We do encourage you to drink plenty of water after the test.

## 2016-10-29 NOTE — Progress Notes (Signed)
Multichannel urodynamic testing with reduction of prolapse with a pessary.  Uroflow - not continuous pattern.  Void 299.  PVR 350 cc.   CMG - S1 169 cc, S2 209 cc, S3 255 cc.  Stable CMG.  VLPP 56 cm H2O at 264 cc.   UPP - 13 cm H2O.   Pressure flow - P Det max 24 cm H2O.  Voided 248 cc.   Stress incontinence.  Low LPP and UPP.  Adequate void with reduction of prolapse.

## 2016-11-04 ENCOUNTER — Encounter: Payer: Self-pay | Admitting: Obstetrics and Gynecology

## 2016-11-04 ENCOUNTER — Ambulatory Visit (INDEPENDENT_AMBULATORY_CARE_PROVIDER_SITE_OTHER): Payer: BC Managed Care – PPO

## 2016-11-04 ENCOUNTER — Ambulatory Visit (INDEPENDENT_AMBULATORY_CARE_PROVIDER_SITE_OTHER): Payer: BC Managed Care – PPO | Admitting: Obstetrics and Gynecology

## 2016-11-04 VITALS — BP 144/84 | HR 62 | Ht 61.75 in | Wt 179.0 lb

## 2016-11-04 DIAGNOSIS — N393 Stress incontinence (female) (male): Secondary | ICD-10-CM

## 2016-11-04 DIAGNOSIS — N390 Urinary tract infection, site not specified: Secondary | ICD-10-CM

## 2016-11-04 DIAGNOSIS — D219 Benign neoplasm of connective and other soft tissue, unspecified: Secondary | ICD-10-CM | POA: Diagnosis not present

## 2016-11-04 DIAGNOSIS — N813 Complete uterovaginal prolapse: Secondary | ICD-10-CM

## 2016-11-04 MED ORDER — ESTROGENS, CONJUGATED 0.625 MG/GM VA CREA: TOPICAL_CREAM | VAGINAL | 2 refills | Status: DC

## 2016-11-04 NOTE — Progress Notes (Signed)
Encounter reviewed by Dr. Brook Amundson C. Silva.  

## 2016-11-04 NOTE — Progress Notes (Signed)
Patient ID: Kellie Case, female   DOB: August 27, 1955, 61 y.o.   MRN: [id number] GYNECOLOGY  VISIT   HPI: 61 y.o.   Married  Caucasian  female   G3P0003 with No LMP recorded. Patient is postmenopausal.   here for pelvic ultrasound for to evaluate ovaries.     Has pelvic organ prolapse and feels progression of the prolapse.   Urodynamic testing done 10/25/16.  Stress incontinence confirmed.  Has low LPP and low UPP.  Adequate voiding with reduction of the prolapse.   On Macrobid 100 mg daily for UTI prophylaxis.  Patient had her annual exam with Dr. Stann Mainland.  GYNECOLOGIC HISTORY: No LMP recorded. Patient is postmenopausal. Contraception: Postmenopausal Menopausal hormone therapy:  none Last mammogram: 11-02-16 with Dr.Wein--results pending Last pap smear:  09-01-15 Neg        OB History    Gravida Para Term Preterm AB Living   3 3 0 0 0 3   SAB TAB Ectopic Multiple Live Births   0 0 0 0 0         Patient Active Problem List   Diagnosis Date Noted  . Recurrent UTI 10/19/2016    Past Medical History:  Diagnosis Date  . Thyroid disease   . Urinary incontinence     Past Surgical History:  Procedure Laterality Date  . BUNIONECTOMY    . TONSILLECTOMY      Current Outpatient Prescriptions  Medication Sig Dispense Refill  . CALCIUM PO Take by mouth.    . CRANBERRY PO Take 2 tablets by mouth daily.    Marland Kitchen dicyclomine (BENTYL) 20 MG tablet as needed.    Marland Kitchen levothyroxine (SYNTHROID) 75 MCG tablet Take 1 tablet by mouth daily.    . Multiple Vitamin (MULTIVITAMIN) tablet Take 1 tablet by mouth daily.    . nitrofurantoin, macrocrystal-monohydrate, (MACROBID) 100 MG capsule Take 1 capsule (100 mg total) by mouth daily. 30 capsule 0   No current facility-administered medications for this visit.      ALLERGIES: Nsaids  Family History  Problem Relation Age of Onset  . Multiple sclerosis Mother   . Liver cancer Maternal Grandmother   . Heart attack Maternal Grandfather   .  Hypertension Maternal Grandfather     Social History   Social History  . Marital status: Married    Spouse name: N/A  . Number of children: N/A  . Years of education: N/A   Occupational History  . Not on file.   Social History Main Topics  . Smoking status: Never Smoker  . Smokeless tobacco: Never Used  . Alcohol use 0.6 oz/week    1 Glasses of wine per week     Comment: occasional  . Drug use: No  . Sexual activity: Yes    Birth control/ protection: Post-menopausal, Condom   Other Topics Concern  . Not on file   Social History Narrative  . No narrative on file    ROS:  Pertinent items are noted in HPI.  PHYSICAL EXAMINATION:    BP (!) 144/84 (BP Location: Right Arm, Patient Position: Sitting, Cuff Size: Normal)   Pulse 62   Ht 5' 1.75" (1.568 m)   Wt 179 lb (81.2 kg)   BMI 33.01 kg/m     General appearance: alert, cooperative and appears stated age   Pelvic ultrasound -  2 calcified fibroids - largest 1 x 6 mm. EMS 3.23 mm. Ovaries normal. No free fluid.  ASSESSMENT  Complete uterovaginal prolapse.  Stress incontinence.  Recurrent UTIs.  Now on Macrobid prophylaxis. Uterine fibroids.   PLAN  Ultrasound findings reviewed of fibroids.   We reviewed her urodynamic testing today and stress incontinence.  We discussed surgical care with abdominal hysterectomy and bilateral salpingo-oophorectomy, abdominal sacrocolpopexy, possible Halban's culdoplasty, anterior and posterior colporrhaphy, TVT Exact midurethral sling and cystoscopy.  We discussed risks, benefits, and alternatives to surgery.  Risks of surgery include but are not limited to bleeding, infection, damage to surrounding organs, ureteral damage, permanent mesh use which may cause erosion and exposure in the vagina, urethra, bladder or ureters, slower voiding and urinary retention, overactive bladder symptoms, reoperation, recurrence of prolapse and incontinence,  DVT, PE, death, pneumonia, and  reaction to anesthesia.    We will proceed forward with an abdominal approach to surgery.   Start Premarin vaginal estrogen cream after we get her mammogram report from Dr. Gwynne Edinger office.  I discussed potential increased risk of breast cancer.   An After Visit Summary was printed and given to the patient.  __25____ minutes face to face time of which over 50% was spent in counseling.

## 2016-11-05 DIAGNOSIS — N813 Complete uterovaginal prolapse: Secondary | ICD-10-CM | POA: Insufficient documentation

## 2016-11-08 ENCOUNTER — Telehealth: Payer: Self-pay | Admitting: Obstetrics and Gynecology

## 2016-11-08 NOTE — Telephone Encounter (Signed)
Call to patient to review benefits for a recommended surgery. Left voicemail requesting a return call.    cc: Lamont Snowball, RN

## 2016-11-09 NOTE — Telephone Encounter (Signed)
Spoke with patient on 11/08/16. Reviewed benefit for surgery. Patient understood and agreeable. Patient confirmed and is ready to proceed with scheduling. Patient aware this is professional benefit only. Patient aware will be contacted by hospital for separate benefits. Forwarding to Conservation officer, historic buildings for scheduling.  Routing to Lamont Snowball, RN

## 2016-11-10 NOTE — Telephone Encounter (Signed)
Surgery date confirmed fro 11-22-16 at [facility] at 0730. Call to patient to review surgery instructions. Left message to call back.

## 2016-11-10 NOTE — Telephone Encounter (Signed)
Patient returning call to [facility]. Patient's cell phone is not working please call her home phone [phone]

## 2016-11-10 NOTE — Telephone Encounter (Signed)
Call to patient. Confirmed surgery date and time. Reviewed surgery instruction sheet and printed copy will be provided at appointment on 11-12-16. Questions answered and brief description of post op restrictions reviewed. Advised Dr Quincy Simmonds will discuss with her further at appointment.   Routing to provider for final review. Patient agreeable to disposition. Will close encounter.

## 2016-11-12 ENCOUNTER — Ambulatory Visit (INDEPENDENT_AMBULATORY_CARE_PROVIDER_SITE_OTHER): Payer: BC Managed Care – PPO | Admitting: Obstetrics and Gynecology

## 2016-11-12 VITALS — BP 140/70 | Ht 61.75 in | Wt 177.0 lb

## 2016-11-12 DIAGNOSIS — N813 Complete uterovaginal prolapse: Secondary | ICD-10-CM | POA: Diagnosis not present

## 2016-11-12 DIAGNOSIS — N393 Stress incontinence (female) (male): Secondary | ICD-10-CM | POA: Diagnosis not present

## 2016-11-12 NOTE — Progress Notes (Signed)
GYNECOLOGY  VISIT   HPI: 61 y.o.   Married  Caucasian  female   G3P0003 with No LMP recorded. Patient is postmenopausal.   here for surgical consult.  Desires hysterectomy, prolapse repair, and surgery for urinary incontinence.   Urodynamics confirms stress incontinence.   US showed 2 fibroids.   Going next week for the preop visit at hospital.  GYNECOLOGIC HISTORY: No LMP recorded. Patient is postmenopausal. Contraception:  Postmenopausal Menopausal hormone therapy:  none Last mammogram:   11-02-16 with Dr.Wein--results normal through letter. Last pap smear: 09-01-15 Neg        OB History    Gravida Para Term Preterm AB Living   3 3 0 0 0 3   SAB TAB Ectopic Multiple Live Births   0 0 0 0 0         Patient Active Problem List   Diagnosis Date Noted  . Complete uterovaginal prolapse 11/05/2016  . Recurrent UTI 10/19/2016    Past Medical History:  Diagnosis Date  . Thyroid disease   . Urinary incontinence     Past Surgical History:  Procedure Laterality Date  . BUNIONECTOMY    . TONSILLECTOMY      Current Outpatient Prescriptions  Medication Sig Dispense Refill  . AZO-CRANBERRY PO Take 1 tablet by mouth daily.    Marland Kitchen CALCIUM PO Take 1 tablet by mouth daily.     Marland Kitchen conjugated estrogens (PREMARIN) vaginal cream Use 1/2 g vaginally every night at bed time for the first 2 weeks, then use 1/2 g vaginally three times per week (Patient taking differently: Place 0.5 g vaginally See admin instructions. Use 1/2 g vaginally every night at bed time for the first 2 weeks, then use 1/2 g vaginally three times per week) 30 g 2  . dicyclomine (BENTYL) 20 MG tablet Take 20 mg by mouth daily as needed for spasms.     Marland Kitchen levothyroxine (SYNTHROID) 75 MCG tablet Take 75 mcg by mouth daily.     . Multiple Vitamin (MULTIVITAMIN) tablet Take 1 tablet by mouth daily.    . nitrofurantoin, macrocrystal-monohydrate, (MACROBID) 100 MG capsule Take 1 capsule (100 mg total) by mouth daily. 30  capsule 0  . Polyethyl Glycol-Propyl Glycol (SYSTANE OP) Place 1 drop into both eyes every morning.     No current facility-administered medications for this visit.      ALLERGIES: Nsaids  Family History  Problem Relation Age of Onset  . Multiple sclerosis Mother   . Liver cancer Maternal Grandmother   . Heart attack Maternal Grandfather   . Hypertension Maternal Grandfather     Social History   Social History  . Marital status: Married    Spouse name: N/A  . Number of children: N/A  . Years of education: N/A   Occupational History  . Not on file.   Social History Main Topics  . Smoking status: Never Smoker  . Smokeless tobacco: Never Used  . Alcohol use 0.6 oz/week    1 Glasses of wine per week     Comment: occasional  . Drug use: No  . Sexual activity: Yes    Birth control/ protection: Post-menopausal, Condom   Other Topics Concern  . Not on file   Social History Narrative  . No narrative on file    ROS:  Pertinent items are noted in HPI.  PHYSICAL EXAMINATION:    BP 140/70 (BP Location: Right Arm, Patient Position: Sitting, Cuff Size: Large)   Ht 5' 1.75" (1.568 m)  Wt 177 lb (80.3 kg)   BMI 32.64 kg/m     General appearance: alert, cooperative and appears stated age Head: Normocephalic, without obvious abnormality, atraumatic Neck: no adenopathy, supple, symmetrical, trachea midline and thyroid normal to inspection and palpation Lungs: clear to auscultation bilaterally Heart: regular rate and rhythm Abdomen: soft, non-tender, no masses,  no organomegaly Extremities: extremities normal, atraumatic, no cyanosis or edema Skin: Skin color, texture, turgor normal. No rashes or lesions Lymph nodes: Cervical, supraclavicular, and axillary nodes normal. No abnormal inguinal nodes palpated Neurologic: Grossly normal  Pelvic: External genitalia:  no lesions              Urethra:  normal appearing urethra with no masses, tenderness or lesions               Bartholins and Skenes: normal                 Vagina: normal appearing vagina with normal color and discharge, no lesions.  Third degree uterine prolapse.               Cervix: no lesions                Bimanual Exam:  Uterus:  normal size, contour, position, consistency, mobility, non-tender              Adnexa: no mass, fullness, tenderness              Rectal exam: Yes.  .  Confirms.              Anus:  normal sphincter tone, no lesions  Chaperone was present for exam.  ASSESSMENT  Complete uterovaginal prolapse.  Genuine stress incontinence.  Fibroids.  PLAN  Discussed total abdominal hysterectomy with bilateral salpingectomy, Abdominal sacrocolpopexy, possible Halban's culdoplasty, anterior and posterior colporrhaphy, TVT Exact midurethral sling and cystoscopy.  3D pelvic model used as well.  Risks, benefits, and alternatives to surgery reviewed. Risks of surgery include but are not limited tobleeding, infection, damage to surrounding organs, use of permanent mesh use which may cause erosion and exposure in the vagina, urethra, bladder or ureters,  slower voiding and urinary retention, possible need for prolonged catheterization and/or self catheterization, de novo overactive bladder symptoms, reoperation, recurrence of prolapse and incontinence,  DVT, PE, death, and reaction to anesthesia.   I have discussed surgical expectations regarding the procedures and success rates, outcomes, and recovery.    Patient wishes to proceed. She will do an enema the night before surgery.  She will get a flu vaccine.   Get copy of mammogram.  An After Visit Summary was printed and given to the patient.  __25____ minutes face to face time of which over 50% was spent in counseling.

## 2016-11-13 ENCOUNTER — Encounter: Payer: Self-pay | Admitting: Obstetrics and Gynecology

## 2016-11-14 ENCOUNTER — Encounter: Payer: Self-pay | Admitting: Obstetrics and Gynecology

## 2016-11-15 ENCOUNTER — Telehealth: Payer: Self-pay | Admitting: Obstetrics and Gynecology

## 2016-11-15 NOTE — Telephone Encounter (Signed)
Patient returning your call.

## 2016-11-15 NOTE — Telephone Encounter (Signed)
Returned call to patient. Patient has confirmed surgery scheduled for 11/22/16

## 2016-11-15 NOTE — Patient Instructions (Addendum)
Your procedure is scheduled on: Monday November 22, 2016 at 7:30 am  Enter through the [employer] [facility] at: 6:00 am  Pick up the phone at the desk and dial [phone].  Call this number if you have problems the morning of surgery: [phone].  Remember: Do NOT eat food or drink any liquids after: Midnight Sunday October 21  Take these medicines the morning of surgery with a SIP OF WATER:  Systane eye drops  Do NOT wear jewelry (body piercing), metal hair clips/bobby pins, make-up, or nail polish. Do NOT wear lotions, powders, or perfumes.  You may wear deoderant. Do NOT shave for 48 hours prior to surgery. Do NOT bring valuables to the hospital. Contacts, dentures, or bridgework may not be worn into surgery.  Leave suitcase in car. After surgery it may be brought up to your room.  For patients being admitted to the hospital, checkout time is 11:00 am the day of discharge

## 2016-11-16 ENCOUNTER — Other Ambulatory Visit: Payer: Self-pay

## 2016-11-16 ENCOUNTER — Encounter (HOSPITAL_COMMUNITY): Payer: Self-pay

## 2016-11-16 ENCOUNTER — Encounter (HOSPITAL_COMMUNITY)
Admission: RE | Admit: 2016-11-16 | Discharge: 2016-11-16 | Disposition: A | Discharge status: Home / Self Care | Payer: BC Managed Care – PPO | Source: Ambulatory Visit | Attending: Obstetrics and Gynecology | Admitting: Obstetrics and Gynecology

## 2016-11-16 DIAGNOSIS — D259 Leiomyoma of uterus, unspecified: Secondary | ICD-10-CM | POA: Diagnosis not present

## 2016-11-16 DIAGNOSIS — Z886 Allergy status to analgesic agent status: Secondary | ICD-10-CM | POA: Insufficient documentation

## 2016-11-16 DIAGNOSIS — Z01812 Encounter for preprocedural laboratory examination: Secondary | ICD-10-CM | POA: Diagnosis not present

## 2016-11-16 DIAGNOSIS — Z9889 Other specified postprocedural states: Secondary | ICD-10-CM | POA: Diagnosis not present

## 2016-11-16 DIAGNOSIS — E079 Disorder of thyroid, unspecified: Secondary | ICD-10-CM | POA: Insufficient documentation

## 2016-11-16 DIAGNOSIS — N814 Uterovaginal prolapse, unspecified: Secondary | ICD-10-CM | POA: Insufficient documentation

## 2016-11-16 DIAGNOSIS — Z79899 Other long term (current) drug therapy: Secondary | ICD-10-CM | POA: Insufficient documentation

## 2016-11-16 DIAGNOSIS — Z87440 Personal history of urinary (tract) infections: Secondary | ICD-10-CM | POA: Diagnosis not present

## 2016-11-16 DIAGNOSIS — N393 Stress incontinence (female) (male): Secondary | ICD-10-CM | POA: Diagnosis not present

## 2016-11-16 HISTORY — DX: Personal history of urinary calculi: Z87.442

## 2016-11-16 HISTORY — DX: Hypothyroidism, unspecified: E03.9

## 2016-11-16 LAB — COMPREHENSIVE METABOLIC PANEL
ALBUMIN: 4 g/dL (ref 3.5–5.0)
ALK PHOS: 89 U/L (ref 38–126)
ALT: 12 U/L — ABNORMAL LOW (ref 14–54)
ANION GAP: 7 (ref 5–15)
AST: 18 U/L (ref 15–41)
BUN: 17 mg/dL (ref 6–20)
CALCIUM: 9.4 mg/dL (ref 8.9–10.3)
CO2: 29 mmol/L (ref 22–32)
Chloride: 102 mmol/L (ref 101–111)
Creatinine, Ser: 0.98 mg/dL (ref 0.44–1.00)
GFR calc Af Amer: >60 mL/min (ref >60)
GFR calc non Af Amer: >60 mL/min (ref >60)
GLUCOSE: 117 mg/dL — AB (ref 65–99)
POTASSIUM: 4.6 mmol/L (ref 3.5–5.1)
SODIUM: 138 mmol/L (ref 135–145)
Total Bilirubin: 0.4 mg/dL (ref 0.3–1.2)
Total Protein: 7 g/dL (ref 6.5–8.1)

## 2016-11-16 LAB — CBC
HEMATOCRIT: 40.9 % (ref 36.0–46.0)
HEMOGLOBIN: 13.8 g/dL (ref 12.0–15.0)
MCH: 30.1 pg (ref 26.0–34.0)
MCHC: 33.7 g/dL (ref 30.0–36.0)
MCV: 89.3 fL (ref 78.0–100.0)
Platelets: 286 10*3/uL (ref 150–400)
RBC: 4.58 MIL/uL (ref 3.87–5.11)
RDW: 12.8 % (ref 11.5–15.5)
WBC: 8.7 10*3/uL (ref 4.0–10.5)

## 2016-11-17 ENCOUNTER — Telehealth: Payer: Self-pay | Admitting: *Deleted

## 2016-11-17 NOTE — Telephone Encounter (Signed)
Spoke with patient, advised as seen below per Dr. Quincy Simmonds. Patient verbalizes understanding and is agreeable. Will close encounter.

## 2016-11-17 NOTE — Telephone Encounter (Signed)
Notes recorded by Burnice Logan, RN on 11/17/2016 at 9:44 AM EDT Left message to call Sharee Pimple at [phone]. See telephone encounter dated 11/17/16.  ------  Notes recorded by Nunzio Cobbs, MD on 11/17/2016 at 6:44 AM EDT Please inform patient of her labs from the hospital preop.  Her glucose was slightly elevated. Dr. Stann Mainland recently tested her for diabetes with a hemoglobin A1C, and it was normal. Her ALT was 2 points below normal and is not of concern. Her blood counts were normal.

## 2016-11-20 NOTE — H&P (Signed)
Office Visit   11/12/2016 [place] Silva, Everardo All, MD  Obstetrics and Gynecology   Complete uterovaginal prolapse +1 more  Dx   Surgical consult ; Referred by Alroy Dust, L.Marlou Sa, MD  Reason for Visit   Additional Documentation   Vitals:   BP 140/70 (BP Location: Right Arm, Patient Position: Sitting, Cuff Size: Large)   Ht 5' 1.75" (1.568 m)   Wt 177 lb (80.3 kg)   BMI 32.64 kg/m   BSA 1.87 m   Flowsheets:   Custom Formula Data,   MEWS Score,   Anthropometrics,   Infectious Disease Screening     Encounter Info:   Billing Info,   History,   Allergies,   Detailed Report     All Notes   Progress Notes by Nunzio Cobbs, MD at 11/12/2016 12:00 PM   Author: Nunzio Cobbs, MD Author Type: Physician Filed: 11/13/2016 12:35 PM  Note Status: Signed Cosign: Cosign Not Required Encounter Date: 11/12/2016  Editor: Nunzio Cobbs, MD (Physician)  Prior Versions: 1. Lowella Fairy, CMA (Certified Medical Assistant) at 11/12/2016 12:20 PM - Sign at close encounter    GYNECOLOGY  VISIT   HPI: 61 y.o.   Married  Caucasian  female   [id number] with No LMP recorded. Patient is postmenopausal.   here for surgical consult.  Desires hysterectomy, prolapse repair, and surgery for urinary incontinence.   Urodynamics confirms stress incontinence.   US showed 2 fibroids.   Going next week for the preop visit at hospital.  GYNECOLOGIC HISTORY: No LMP recorded. Patient is postmenopausal. Contraception:  Postmenopausal Menopausal hormone therapy:  none Last mammogram:  11-02-16 with Dr.Wein--results normal through letter. Last pap smear: 09-01-15 Neg                OB History    Gravida Para Term Preterm AB Living   3 3 0 0 0 3   SAB TAB Ectopic Multiple Live Births   0 0 0 0 0             Patient Active Problem List   Diagnosis Date Noted  . Complete uterovaginal prolapse 11/05/2016  . Recurrent  UTI 10/19/2016        Past Medical History:  Diagnosis Date  . Thyroid disease   . Urinary incontinence          Past Surgical History:  Procedure Laterality Date  . BUNIONECTOMY    . TONSILLECTOMY            Current Outpatient Prescriptions  Medication Sig Dispense Refill  . AZO-CRANBERRY PO Take 1 tablet by mouth daily.    Marland Kitchen CALCIUM PO Take 1 tablet by mouth daily.     Marland Kitchen conjugated estrogens (PREMARIN) vaginal cream Use 1/2 g vaginally every night at bed time for the first 2 weeks, then use 1/2 g vaginally three times per week (Patient taking differently: Place 0.5 g vaginally See admin instructions. Use 1/2 g vaginally every night at bed time for the first 2 weeks, then use 1/2 g vaginally three times per week) 30 g 2  . dicyclomine (BENTYL) 20 MG tablet Take 20 mg by mouth daily as needed for spasms.     Marland Kitchen levothyroxine (SYNTHROID) 75 MCG tablet Take 75 mcg by mouth daily.     . Multiple Vitamin (MULTIVITAMIN) tablet Take 1 tablet by mouth daily.    . nitrofurantoin, macrocrystal-monohydrate, (MACROBID) 100 MG capsule Take  1 capsule (100 mg total) by mouth daily. 30 capsule 0  . Polyethyl Glycol-Propyl Glycol (SYSTANE OP) Place 1 drop into both eyes every morning.     No current facility-administered medications for this visit.      ALLERGIES: Nsaids       Family History  Problem Relation Age of Onset  . Multiple sclerosis Mother   . Liver cancer Maternal Grandmother   . Heart attack Maternal Grandfather   . Hypertension Maternal Grandfather     Social History        Social History  . Marital status: Married    Spouse name: N/A  . Number of children: N/A  . Years of education: N/A      Occupational History  . Not on file.         Social History Main Topics  . Smoking status: Never Smoker  . Smokeless tobacco: Never Used  . Alcohol use 0.6 oz/week    1 Glasses of wine per week     Comment: occasional  . Drug  use: No  . Sexual activity: Yes    Birth control/ protection: Post-menopausal, Condom       Other Topics Concern  . Not on file      Social History Narrative  . No narrative on file    ROS:  Pertinent items are noted in HPI.  PHYSICAL EXAMINATION:    BP 140/70 (BP Location: Right Arm, Patient Position: Sitting, Cuff Size: Large)   Ht 5' 1.75" (1.568 m)   Wt 177 lb (80.3 kg)   BMI 32.64 kg/m     General appearance: alert, cooperative and appears stated age Head: Normocephalic, without obvious abnormality, atraumatic Neck: no adenopathy, supple, symmetrical, trachea midline and thyroid normal to inspection and palpation Lungs: clear to auscultation bilaterally Heart: regular rate and rhythm Abdomen: soft, non-tender, no masses,  no organomegaly Extremities: extremities normal, atraumatic, no cyanosis or edema Skin: Skin color, texture, turgor normal. No rashes or lesions Lymph nodes: Cervical, supraclavicular, and axillary nodes normal. No abnormal inguinal nodes palpated Neurologic: Grossly normal  Pelvic: External genitalia:  no lesions              Urethra:  normal appearing urethra with no masses, tenderness or lesions              Bartholins and Skenes: normal                 Vagina: normal appearing vagina with normal color and discharge, no lesions.  Third degree uterine prolapse.               Cervix: no lesions                Bimanual Exam:  Uterus:  normal size, contour, position, consistency, mobility, non-tender              Adnexa: no mass, fullness, tenderness              Rectal exam: Yes.  .  Confirms.              Anus:  normal sphincter tone, no lesions  Chaperone was present for exam.  ASSESSMENT  Complete uterovaginal prolapse.  Genuine stress incontinence.  Fibroids.  PLAN  Discussed total abdominal hysterectomy with bilateral salpingectomy, Abdominal sacrocolpopexy, possible Halban's culdoplasty, anterior and posterior  colporrhaphy, TVT Exact midurethral sling and cystoscopy.  3D pelvic model used as well.  Risks, benefits, and alternatives to surgery reviewed. Risks of  surgery include but are not limited tobleeding, infection, damage to surrounding organs, use of permanent mesh use which may cause erosion and exposure in the vagina, urethra, bladder or ureters,  slower voiding and urinary retention, possible need for prolonged catheterization and/or self catheterization, de novo overactive bladder symptoms, reoperation, recurrence of prolapse and incontinence,  DVT, PE, death, and reaction to anesthesia.   I have discussed surgical expectations regarding the procedures and success rates, outcomes, and recovery.    Patient wishes to proceed. She will do an enema the night before surgery.  She will get a flu vaccine.   Get copy of mammogram.  An After Visit Summary was printed and given to the patient.  __25____ minutes face to face time of which over 50% was spent in counseling.

## 2016-11-22 ENCOUNTER — Inpatient Hospital Stay (HOSPITAL_COMMUNITY)
Admission: RE | Admit: 2016-11-22 | Discharge: 2016-11-24 | DRG: 743 | Disposition: A | Discharge status: Home / Self Care | Payer: BC Managed Care – PPO | Source: Ambulatory Visit | Attending: Obstetrics and Gynecology | Admitting: Obstetrics and Gynecology

## 2016-11-22 ENCOUNTER — Inpatient Hospital Stay (HOSPITAL_COMMUNITY): Payer: BC Managed Care – PPO | Admitting: Anesthesiology

## 2016-11-22 ENCOUNTER — Encounter (HOSPITAL_COMMUNITY): Payer: Self-pay

## 2016-11-22 ENCOUNTER — Encounter (HOSPITAL_COMMUNITY)
Admission: RE | Disposition: A | Discharge status: Home / Self Care | Payer: Self-pay | Source: Ambulatory Visit | Attending: Obstetrics and Gynecology

## 2016-11-22 DIAGNOSIS — N393 Stress incontinence (female) (male): Secondary | ICD-10-CM | POA: Diagnosis not present

## 2016-11-22 DIAGNOSIS — N813 Complete uterovaginal prolapse: Secondary | ICD-10-CM | POA: Diagnosis not present

## 2016-11-22 DIAGNOSIS — Z90710 Acquired absence of both cervix and uterus: Secondary | ICD-10-CM

## 2016-11-22 DIAGNOSIS — D259 Leiomyoma of uterus, unspecified: Secondary | ICD-10-CM | POA: Diagnosis present

## 2016-11-22 DIAGNOSIS — N800 Endometriosis of uterus: Secondary | ICD-10-CM | POA: Diagnosis present

## 2016-11-22 DIAGNOSIS — Z6832 Body mass index (BMI) 32.0-32.9, adult: Secondary | ICD-10-CM

## 2016-11-22 DIAGNOSIS — Z9079 Acquired absence of other genital organ(s): Secondary | ICD-10-CM

## 2016-11-22 DIAGNOSIS — E039 Hypothyroidism, unspecified: Secondary | ICD-10-CM | POA: Diagnosis present

## 2016-11-22 DIAGNOSIS — Z90722 Acquired absence of ovaries, bilateral: Secondary | ICD-10-CM

## 2016-11-22 DIAGNOSIS — E669 Obesity, unspecified: Secondary | ICD-10-CM | POA: Diagnosis present

## 2016-11-22 HISTORY — PX: CYSTOSCOPY: SHX5120

## 2016-11-22 HISTORY — PX: BLADDER SUSPENSION: SHX72

## 2016-11-22 HISTORY — PX: ANTERIOR AND POSTERIOR REPAIR: SHX5121

## 2016-11-22 LAB — TYPE AND SCREEN
ABO/RH(D): O POS
Antibody Screen: NEGATIVE

## 2016-11-22 LAB — ABO/RH: ABO/RH(D): O POS

## 2016-11-22 SURGERY — HYSTERECTOMY, ABDOMINAL, WITH SALPINGO-OOPHORECTOMY
Anesthesia: General | Site: Vagina

## 2016-11-22 MED ORDER — LACTATED RINGERS IV SOLN
INTRAVENOUS | Status: DC
  Administered 2016-11-22 (×2): via INTRAVENOUS

## 2016-11-22 MED ORDER — LIDOCAINE-EPINEPHRINE 1 %-1:100000 IJ SOLN
INTRAMUSCULAR | Status: AC
  Filled 2016-11-22: qty 1

## 2016-11-22 MED ORDER — GABAPENTIN 300 MG PO CAPS
ORAL_CAPSULE | ORAL | Status: AC
  Administered 2016-11-22: 300 mg via ORAL
  Filled 2016-11-22: qty 1

## 2016-11-22 MED ORDER — CEFOTETAN DISODIUM-DEXTROSE 2-2.08 GM-%(50ML) IV SOLR
2.0000 g | INTRAVENOUS | Status: AC
  Administered 2016-11-22: 2 g via INTRAVENOUS

## 2016-11-22 MED ORDER — 0.9 % SODIUM CHLORIDE (POUR BTL) OPTIME
TOPICAL | Status: DC | PRN
  Administered 2016-11-22 (×2): 2000 mL

## 2016-11-22 MED ORDER — FENTANYL CITRATE (PF) 250 MCG/5ML IJ SOLN
INTRAMUSCULAR | Status: AC
  Filled 2016-11-22: qty 5

## 2016-11-22 MED ORDER — ONDANSETRON HCL 4 MG/2ML IJ SOLN
INTRAMUSCULAR | Status: AC
  Filled 2016-11-22: qty 2

## 2016-11-22 MED ORDER — LEVOTHYROXINE SODIUM 75 MCG PO TABS
75.0000 ug | ORAL_TABLET | Freq: Every day | ORAL | Status: DC
  Filled 2016-11-22 (×2): qty 1

## 2016-11-22 MED ORDER — LACTATED RINGERS IV SOLN
INTRAVENOUS | Status: DC
  Administered 2016-11-22: 125 mL/h via INTRAVENOUS

## 2016-11-22 MED ORDER — ROCURONIUM BROMIDE 100 MG/10ML IV SOLN
INTRAVENOUS | Status: DC | PRN
  Administered 2016-11-22 (×2): 10 mg via INTRAVENOUS
  Administered 2016-11-22: 40 mg via INTRAVENOUS
  Administered 2016-11-22 (×2): 10 mg via INTRAVENOUS

## 2016-11-22 MED ORDER — MENTHOL 3 MG MT LOZG
1.0000 | LOZENGE | OROMUCOSAL | Status: DC | PRN
  Filled 2016-11-22: qty 9

## 2016-11-22 MED ORDER — CEFOTETAN DISODIUM-DEXTROSE 2-2.08 GM-%(50ML) IV SOLR
INTRAVENOUS | Status: AC
  Filled 2016-11-22: qty 50

## 2016-11-22 MED ORDER — LIDOCAINE HCL (CARDIAC) 20 MG/ML IV SOLN
INTRAVENOUS | Status: DC | PRN
  Administered 2016-11-22: 80 mg via INTRAVENOUS

## 2016-11-22 MED ORDER — ONDANSETRON HCL 4 MG PO TABS: 4.0000 mg | ORAL_TABLET | Freq: Four times a day (QID) | ORAL | Status: DC | PRN

## 2016-11-22 MED ORDER — ONDANSETRON HCL 4 MG/2ML IJ SOLN
INTRAMUSCULAR | Status: DC | PRN
  Administered 2016-11-22: 4 mg via INTRAVENOUS

## 2016-11-22 MED ORDER — LIDOCAINE HCL (CARDIAC) 20 MG/ML IV SOLN
INTRAVENOUS | Status: AC
  Filled 2016-11-22: qty 5

## 2016-11-22 MED ORDER — ESTRADIOL 0.1 MG/GM VA CREA
TOPICAL_CREAM | VAGINAL | Status: DC | PRN
  Administered 2016-11-22: 1 via VAGINAL

## 2016-11-22 MED ORDER — LIDOCAINE-EPINEPHRINE 1 %-1:100000 IJ SOLN
INTRAMUSCULAR | Status: DC | PRN
  Administered 2016-11-22: 8 mL

## 2016-11-22 MED ORDER — PROPOFOL 10 MG/ML IV BOLUS
INTRAVENOUS | Status: DC | PRN
  Administered 2016-11-22: 170 mg via INTRAVENOUS

## 2016-11-22 MED ORDER — SCOPOLAMINE 1 MG/3DAYS TD PT72
1.0000 | MEDICATED_PATCH | Freq: Once | TRANSDERMAL | Status: DC
  Administered 2016-11-22: 1.5 mg via TRANSDERMAL

## 2016-11-22 MED ORDER — LACTATED RINGERS IV SOLN
INTRAVENOUS | Status: DC
  Administered 2016-11-22: 14:00:00 via INTRAVENOUS

## 2016-11-22 MED ORDER — HYDROMORPHONE HCL 1 MG/ML IJ SOLN
INTRAMUSCULAR | Status: AC
  Filled 2016-11-22: qty 1

## 2016-11-22 MED ORDER — ESTRADIOL 0.1 MG/GM VA CREA
TOPICAL_CREAM | VAGINAL | Status: AC
  Filled 2016-11-22: qty 42.5

## 2016-11-22 MED ORDER — DIPHENHYDRAMINE HCL 50 MG/ML IJ SOLN: 12.5000 mg | Freq: Four times a day (QID) | INTRAMUSCULAR | Status: DC | PRN

## 2016-11-22 MED ORDER — ONDANSETRON HCL 4 MG/2ML IJ SOLN: 4.0000 mg | Freq: Four times a day (QID) | INTRAMUSCULAR | Status: DC | PRN

## 2016-11-22 MED ORDER — DEXAMETHASONE SODIUM PHOSPHATE 4 MG/ML IJ SOLN
INTRAMUSCULAR | Status: AC
  Filled 2016-11-22: qty 1

## 2016-11-22 MED ORDER — FENTANYL CITRATE (PF) 100 MCG/2ML IJ SOLN
INTRAMUSCULAR | Status: DC | PRN
  Administered 2016-11-22 (×2): 50 ug via INTRAVENOUS
  Administered 2016-11-22 (×3): 100 ug via INTRAVENOUS

## 2016-11-22 MED ORDER — GABAPENTIN 300 MG PO CAPS
300.0000 mg | ORAL_CAPSULE | Freq: Once | ORAL | Status: AC
  Administered 2016-11-22: 300 mg via ORAL

## 2016-11-22 MED ORDER — HYDROMORPHONE HCL 1 MG/ML IJ SOLN
INTRAMUSCULAR | Status: DC | PRN
  Administered 2016-11-22: 1 mg via INTRAVENOUS

## 2016-11-22 MED ORDER — MIDAZOLAM HCL 2 MG/2ML IJ SOLN
INTRAMUSCULAR | Status: AC
  Filled 2016-11-22: qty 2

## 2016-11-22 MED ORDER — ADULT MULTIVITAMIN W/MINERALS CH
1.0000 | ORAL_TABLET | Freq: Every day | ORAL | Status: DC
  Administered 2016-11-23: 1 via ORAL
  Filled 2016-11-22 (×3): qty 1

## 2016-11-22 MED ORDER — SODIUM CHLORIDE 0.9% FLUSH: 9.0000 mL | INTRAVENOUS | Status: DC | PRN

## 2016-11-22 MED ORDER — MORPHINE SULFATE 2 MG/ML IV SOLN: INTRAVENOUS | Status: DC

## 2016-11-22 MED ORDER — NALOXONE HCL 0.4 MG/ML IJ SOLN: 0.4000 mg | INTRAMUSCULAR | Status: DC | PRN

## 2016-11-22 MED ORDER — DICYCLOMINE HCL 20 MG PO TABS
20.0000 mg | ORAL_TABLET | Freq: Every day | ORAL | Status: DC | PRN
  Filled 2016-11-22: qty 1

## 2016-11-22 MED ORDER — DICYCLOMINE HCL 10 MG PO CAPS
20.0000 mg | ORAL_CAPSULE | Freq: Every day | ORAL | Status: DC | PRN
  Filled 2016-11-22: qty 2

## 2016-11-22 MED ORDER — ROCURONIUM BROMIDE 100 MG/10ML IV SOLN
INTRAVENOUS | Status: AC
  Filled 2016-11-22: qty 1

## 2016-11-22 MED ORDER — MIDAZOLAM HCL 2 MG/2ML IJ SOLN
INTRAMUSCULAR | Status: DC | PRN
  Administered 2016-11-22: 2 mg via INTRAVENOUS

## 2016-11-22 MED ORDER — DIPHENHYDRAMINE HCL 12.5 MG/5ML PO ELIX: 12.5000 mg | ORAL_SOLUTION | Freq: Four times a day (QID) | ORAL | Status: DC | PRN

## 2016-11-22 MED ORDER — FENTANYL CITRATE (PF) 100 MCG/2ML IJ SOLN: 25.0000 ug | INTRAMUSCULAR | Status: DC | PRN

## 2016-11-22 MED ORDER — POLYETHYL GLYCOL-PROPYL GLYCOL 0.4-0.3 % OP GEL: OPHTHALMIC | Status: DC

## 2016-11-22 MED ORDER — MORPHINE SULFATE 2 MG/ML IV SOLN
INTRAVENOUS | Status: DC
  Administered 2016-11-22: 15:00:00 via INTRAVENOUS
  Administered 2016-11-22: 3 mg via INTRAVENOUS
  Filled 2016-11-22: qty 30

## 2016-11-22 MED ORDER — SUGAMMADEX SODIUM 200 MG/2ML IV SOLN
INTRAVENOUS | Status: AC
  Filled 2016-11-22: qty 2

## 2016-11-22 MED ORDER — OXYCODONE-ACETAMINOPHEN 5-325 MG PO TABS
1.0000 | ORAL_TABLET | ORAL | Status: DC | PRN
  Administered 2016-11-23 – 2016-11-24 (×6): 1 via ORAL
  Filled 2016-11-22 (×6): qty 1

## 2016-11-22 MED ORDER — ACETAMINOPHEN 500 MG PO TABS
ORAL_TABLET | ORAL | Status: AC
  Administered 2016-11-22: 1000 mg via ORAL
  Filled 2016-11-22: qty 2

## 2016-11-22 MED ORDER — STERILE WATER FOR IRRIGATION IR SOLN
Status: DC | PRN
  Administered 2016-11-22: 1000 mL via INTRAVESICAL

## 2016-11-22 MED ORDER — SUGAMMADEX SODIUM 200 MG/2ML IV SOLN
INTRAVENOUS | Status: DC | PRN
  Administered 2016-11-22: 200 mg via INTRAVENOUS

## 2016-11-22 MED ORDER — ARTIFICIAL TEARS OPHTHALMIC OINT
TOPICAL_OINTMENT | Freq: Every day | OPHTHALMIC | Status: DC
  Filled 2016-11-22: qty 3.5

## 2016-11-22 MED ORDER — SCOPOLAMINE 1 MG/3DAYS TD PT72
MEDICATED_PATCH | TRANSDERMAL | Status: AC
  Administered 2016-11-22: 1.5 mg via TRANSDERMAL
  Filled 2016-11-22: qty 1

## 2016-11-22 MED ORDER — ACETAMINOPHEN 500 MG PO TABS
1000.0000 mg | ORAL_TABLET | Freq: Once | ORAL | Status: AC
  Administered 2016-11-22: 1000 mg via ORAL

## 2016-11-22 MED ORDER — PROPOFOL 10 MG/ML IV BOLUS
INTRAVENOUS | Status: AC
  Filled 2016-11-22: qty 20

## 2016-11-22 MED ORDER — PROMETHAZINE HCL 25 MG/ML IJ SOLN: 6.2500 mg | INTRAMUSCULAR | Status: DC | PRN

## 2016-11-22 MED ORDER — DEXAMETHASONE SODIUM PHOSPHATE 10 MG/ML IJ SOLN
INTRAMUSCULAR | Status: DC | PRN
  Administered 2016-11-22: 10 mg via INTRAVENOUS

## 2016-11-22 SURGICAL SUPPLY — 69 items
ADH SKN CLS APL DERMABOND .7 (GAUZE/BANDAGES/DRESSINGS) ×8
AGENT HMST KT MTR STRL THRMB (HEMOSTASIS) ×4
BLADE SURG 11 STRL SS (BLADE) ×6 IMPLANT
BLADE SURG 15 STRL LF C SS BP (BLADE) ×4 IMPLANT
BLADE SURG 15 STRL SS (BLADE) ×6
CANISTER SUCT 3000ML PPV (MISCELLANEOUS) ×6 IMPLANT
CATH FOLEY 2WAY SLVR  5CC 18FR (CATHETERS) ×2
CATH FOLEY 2WAY SLVR 5CC 18FR (CATHETERS) ×4 IMPLANT
CLOTH BEACON ORANGE TIMEOUT ST (SAFETY) ×6 IMPLANT
CONT PATH 16OZ SNAP LID 3702 (MISCELLANEOUS) ×6 IMPLANT
DECANTER SPIKE VIAL GLASS SM (MISCELLANEOUS) ×6 IMPLANT
DERMABOND ADVANCED (GAUZE/BANDAGES/DRESSINGS) ×4
DERMABOND ADVANCED .7 DNX12 (GAUZE/BANDAGES/DRESSINGS) ×8 IMPLANT
DEVICE CAPIO SLIM SINGLE (INSTRUMENTS) IMPLANT
DISSECTOR SPONGE CHERRY (GAUZE/BANDAGES/DRESSINGS) ×6 IMPLANT
DRAPE UNDERBUTTOCKS STRL (DRAPE) ×6 IMPLANT
DRAPE WARM FLUID 44X44 (DRAPE) ×6 IMPLANT
DRSG OPSITE POSTOP 4X10 (GAUZE/BANDAGES/DRESSINGS) ×6 IMPLANT
DURAPREP 26ML APPLICATOR (WOUND CARE) ×6 IMPLANT
ELECT BLADE 6.5 EXT (BLADE) ×6 IMPLANT
FILTER STRAW FLUID ASPIR (MISCELLANEOUS) IMPLANT
GAUZE PACKING 2X5 YD STRL (GAUZE/BANDAGES/DRESSINGS) ×6 IMPLANT
GAUZE SPONGE 4X4 16PLY XRAY LF (GAUZE/BANDAGES/DRESSINGS) ×6 IMPLANT
GLOVE BIO SURGEON STRL SZ 6.5 (GLOVE) ×10 IMPLANT
GLOVE BIO SURGEONS STRL SZ 6.5 (GLOVE) ×2
GLOVE BIOGEL PI IND STRL 6.5 (GLOVE) ×4 IMPLANT
GLOVE BIOGEL PI IND STRL 7.0 (GLOVE) ×16 IMPLANT
GLOVE BIOGEL PI INDICATOR 6.5 (GLOVE) ×2
GLOVE BIOGEL PI INDICATOR 7.0 (GLOVE) ×8
GOWN STRL REUS W/TWL LRG LVL3 (GOWN DISPOSABLE) ×24 IMPLANT
HEMOSTAT ARISTA ABSORB 3G PWDR (MISCELLANEOUS) IMPLANT
LEGGING LITHOTOMY PAIR STRL (DRAPES) ×6 IMPLANT
NEEDLE HYPO 22GX1.5 SAFETY (NEEDLE) ×6 IMPLANT
NEEDLE MAYO 6 CRC TAPER PT (NEEDLE) IMPLANT
NS IRRIG 1000ML POUR BTL (IV SOLUTION) ×18 IMPLANT
PACK ABDOMINAL GYN (CUSTOM PROCEDURE TRAY) ×6 IMPLANT
PACK TRENDGUARD 450 HYBRID PRO (MISCELLANEOUS) ×4 IMPLANT
PACK TRENDGUARD 600 HYBRD PROC (MISCELLANEOUS) IMPLANT
PACK VAGINAL WOMENS (CUSTOM PROCEDURE TRAY) ×6 IMPLANT
PAD MAGNETIC INST (MISCELLANEOUS) ×6 IMPLANT
RTRCTR C-SECT PINK 25CM LRG (MISCELLANEOUS) ×6 IMPLANT
SET CYSTO W/LG BORE CLAMP LF (SET/KITS/TRAYS/PACK) ×6 IMPLANT
SHEET LAVH (DRAPES) ×6 IMPLANT
SLING TVT EXACT (Sling) ×6 IMPLANT
SPONGE LAP 18X18 X RAY DECT (DISPOSABLE) ×6 IMPLANT
SPONGE SURGIFOAM ABS GEL 12-7 (HEMOSTASIS) IMPLANT
SURGIFLO W/THROMBIN 8M KIT (HEMOSTASIS) ×6 IMPLANT
SUT CAPIO ETHIBPND (SUTURE) IMPLANT
SUT ETHIBOND 0 (SUTURE) ×18 IMPLANT
SUT PLAIN 2 0 XLH (SUTURE) ×6 IMPLANT
SUT VIC AB 0 CT1 18XCR BRD8 (SUTURE) ×16 IMPLANT
SUT VIC AB 0 CT1 27 (SUTURE) ×12
SUT VIC AB 0 CT1 27XBRD ANBCTR (SUTURE) ×8 IMPLANT
SUT VIC AB 0 CT1 8-18 (SUTURE) ×20
SUT VIC AB 2-0 CT1 (SUTURE) ×6 IMPLANT
SUT VIC AB 2-0 CT1 27 (SUTURE)
SUT VIC AB 2-0 CT1 TAPERPNT 27 (SUTURE) IMPLANT
SUT VIC AB 2-0 CT2 27 (SUTURE) IMPLANT
SUT VIC AB 2-0 SH 27 (SUTURE) ×12
SUT VIC AB 2-0 SH 27XBRD (SUTURE) ×8 IMPLANT
SUT VIC AB 2-0 UR6 27 (SUTURE) IMPLANT
SUT VIC AB 4-0 PS2 27 (SUTURE) ×6 IMPLANT
TOWEL OR 17X24 6PK STRL BLUE (TOWEL DISPOSABLE) ×12 IMPLANT
TRAY FOLEY CATH SILVER 14FR (SET/KITS/TRAYS/PACK) ×6 IMPLANT
TRAY FOLEY CATH SILVER 16FR (SET/KITS/TRAYS/PACK) ×6 IMPLANT
TRENDGUARD 450 HYBRID PRO PACK (MISCELLANEOUS) ×6
TRENDGUARD 600 HYBRID PROC PK (MISCELLANEOUS)
TUBING NON-CON 1/4 X 20 CONN (TUBING) ×10 IMPLANT
TUBING NON-CON 1/4 X 20' CONN (TUBING) ×2

## 2016-11-22 NOTE — Progress Notes (Signed)
Update to History and Physical  No marked change in status since office preop visit.   Patient examined.   OK to proceed.  

## 2016-11-22 NOTE — Transfer of Care (Signed)
Immediate Anesthesia Transfer of Care Note  Patient: Kellie Case  Procedure(s) Performed: HYSTERECTOMY ABDOMINAL WITH SALPINGO-OOPHORECTOMY (Bilateral Abdomen) POSTERIOR REPAIR (RECTOCELE) (N/A Vagina ) TRANSVAGINAL TAPE (TVT) PROCEDURE exact mid urethral sling (N/A Vagina ) CYSTOSCOPY (N/A Bladder)  Patient Location: PACU  Anesthesia Type:General  Level of Consciousness: awake, alert  and oriented  Airway & Oxygen Therapy: Patient Spontanous Breathing and Patient connected to nasal cannula oxygen  Post-op Assessment: Report given to RN, Post -op Vital signs reviewed and stable and Patient moving all extremities X 4  Post vital signs: Reviewed and stable  Last Vitals:  Vitals:   11/22/16 0612 11/22/16 1131  BP: (!) 152/85 (!) 146/86  Pulse: 77 86  Resp: 16 12  Temp: 36.7 C 36.6 C  SpO2: 97% 96%    Last Pain:  Vitals:   11/22/16 0612  TempSrc: Oral      Patients Stated Pain Goal: 3 (78/29/56 2130)  Complications: No apparent anesthesia complications

## 2016-11-22 NOTE — Anesthesia Postprocedure Evaluation (Signed)
Anesthesia Post Note  Patient: TIYANA GALLA  Procedure(s) Performed: HYSTERECTOMY ABDOMINAL WITH SALPINGO-OOPHORECTOMY (Bilateral Abdomen) POSTERIOR REPAIR (RECTOCELE) (N/A Vagina ) TRANSVAGINAL TAPE (TVT) PROCEDURE exact mid urethral sling (N/A Vagina ) CYSTOSCOPY (N/A Bladder)     Patient location during evaluation: PACU Anesthesia Type: General Level of consciousness: awake Pain management: pain level controlled Vital Signs Assessment: post-procedure vital signs reviewed and stable Respiratory status: spontaneous breathing Cardiovascular status: stable Postop Assessment: no apparent nausea or vomiting Anesthetic complications: no    Last Vitals:  Vitals:   11/22/16 1215 11/22/16 1230  BP: (!) 162/84 (!) 162/88  Pulse: 78 71  Resp: 11 11  Temp:    SpO2: 98% 98%    Last Pain:  Vitals:   11/22/16 1200  TempSrc:   PainSc: 2    Pain Goal: Patients Stated Pain Goal: 3 (11/22/16 0612)               [place]

## 2016-11-22 NOTE — Anesthesia Procedure Notes (Addendum)
Procedure Name: Intubation Date/Time: 11/22/2016 7:35 AM Performed by: Casimer Lanius A Pre-anesthesia Checklist: Patient identified, Emergency Drugs available, Suction available and Patient being monitored Patient Re-evaluated:Patient Re-evaluated prior to induction Oxygen Delivery Method: Circle system utilized and Simple face mask Preoxygenation: Pre-oxygenation with 100% oxygen Induction Type: IV induction, Cricoid Pressure applied and Combination inhalational/ intravenous induction Ventilation: Mask ventilation without difficulty Laryngoscope Size: Mac and 3 Grade View: Grade III Tube type: Oral Tube size: 7.0 mm Number of attempts: 3 (LB X1 ST X2 Mac 3X2 Lopro 3 X1) Airway Equipment and Method: Stylet and Video-laryngoscopy Placement Confirmation: ETT inserted through vocal cords under direct vision,  positive ETCO2 and breath sounds checked- equal and bilateral Secured at: 21 (right lip) cm Tube secured with: Tape Dental Injury: Teeth and Oropharynx as per pre-operative assessment  Difficulty Due To: Difficulty was unanticipated Comments: Attempt x1 by CRNA with Grade 4 view.  Attempt x1 by MDA with MAC 3 with Grade 3 view.  Glidescope with #3 blade with good visualization of glottis, easy passage of ETT through vocal cords.

## 2016-11-22 NOTE — Addendum Note (Signed)
Addendum  created 11/22/16 1654 by Levie Heritage, RN   Sign clinical note

## 2016-11-22 NOTE — Anesthesia Preprocedure Evaluation (Signed)
Anesthesia Evaluation  Patient identified by MRN, date of birth, ID band Patient awake    Reviewed: Allergy & Precautions, NPO status , Patient's Chart, lab work & pertinent test results  Airway Mallampati: II  TM Distance: >3 FB Neck ROM: Full    Dental  (+) Teeth Intact, Dental Advisory Given   Pulmonary neg pulmonary ROS,    Pulmonary exam normal breath sounds clear to auscultation       Cardiovascular Normal cardiovascular exam Rhythm:Regular Rate:Normal  HLD   Neuro/Psych negative neurological ROS  negative psych ROS   GI/Hepatic negative GI ROS, Neg liver ROS,   Endo/Other  Hypothyroidism Obesity   Renal/GU negative Renal ROS   Urinary incontinence     Musculoskeletal negative musculoskeletal ROS (+)   Abdominal   Peds  Hematology negative hematology ROS (+)   Anesthesia Other Findings Day of surgery medications reviewed with the patient.  Reproductive/Obstetrics                             Anesthesia Physical Anesthesia Plan  ASA: II  Anesthesia Plan: General   Post-op Pain Management:    Induction: Intravenous  PONV Risk Score and Plan: 4 or greater and Ondansetron, Dexamethasone, Midazolam and Scopolamine patch - Pre-op  Airway Management Planned: Oral ETT  Additional Equipment:   Intra-op Plan:   Post-operative Plan: Extubation in OR  Informed Consent: I have reviewed the patients History and Physical, chart, labs and discussed the procedure including the risks, benefits and alternatives for the proposed anesthesia with the patient or authorized representative who has indicated his/her understanding and acceptance.   Dental advisory given  Plan Discussed with: CRNA  Anesthesia Plan Comments: (2nd IV after induction)        Anesthesia Quick Evaluation

## 2016-11-22 NOTE — Progress Notes (Signed)
Day of Surgery Procedure(s) (LRB): HYSTERECTOMY ABDOMINAL WITH SALPINGO-OOPHORECTOMY (Bilateral) POSTERIOR REPAIR (RECTOCELE) (N/A) TRANSVAGINAL TAPE (TVT) PROCEDURE exact mid urethral sling (N/A) CYSTOSCOPY (N/A)  Subjective: Patient reports it hurts to move in the bed.  Some low back pain.  Tolerating clear liquids. Not using much pain medication.  Objective: I have reviewed patient's vital signs, intake and output and labs. Vitals:   11/22/16 1528 11/22/16 1630  BP: (!) 159/73 (!) 161/76  Pulse: 67 64  Resp: 14 16  Temp: 98.2 F (36.8 C) 97.7 F (36.5 C)  SpO2: 97% 97%   I/O - 1970 cc IV/650 cc uo - clear and yellow.   General: alert and cooperative Resp: clear to auscultation bilaterally Cardio: regular rate and rhythm, S1, S2 normal, no murmur, click, rub or gallop GI: soft, non-tender; bowel sounds normal; no masses,  no organomegaly and incision: Dressing clean, dry, intact. Extremities: PAS and TED hose on.  DPs 2+ bilaterally. Vaginal Bleeding: none  Assessment: s/p Procedure(s): HYSTERECTOMY ABDOMINAL WITH SALPINGO-OOPHORECTOMY (Bilateral) POSTERIOR REPAIR (RECTOCELE) (N/A) TRANSVAGINAL TAPE (TVT) PROCEDURE exact mid urethral sling (N/A) CYSTOSCOPY (N/A): Elevated blood pressure. I am attributing this to post op pain.  Plan: Encouraged use of PCA.  Foley overnight.  CBC and BMP in am.  Surgical findings and procedure reviewed.   Questions answered.  LOS: 0 days    Arloa Koh 11/22/2016, 5:31 PM

## 2016-11-22 NOTE — Op Note (Signed)
OPERATIVE REPORT  PREOPERATIVE DIAGNOSIS: Complete uterovaginal prolapse.  POSTOPERATIVE DIAGNOSIS: Complete uterovaginal prolapse.  PROCEDURE: Total abdominal hysterectomy with bilateral salpingo- oophorectomy,  TVT Exact midurethral sling and cystoscopy, posterior colporrhaphy.  SURGEON: Lenard Galloway, MD  ASSISTANT:  Megan Salon, MD  ANESTHESIA: General endotracheal.  IV FLUIDS: 1800 cc LR.   EBL:  200 cc.   URINE OUTPUT: 200 cc.  COMPLICATIONS: None.  INDICATIONS FOR THE PROCEDURE: The patient is a 61 year old gravida 45, para 3, Caucasian female, who presents with progressive third degree uterovaginal prolapse and a request for surgical care. The patient has been noted to develop worsening uterovaginal prolapse and stress incontinence.  She has  recurrent urinary tract infections and has been taking Macrobid prophylaxis. She did undergo multichannel urodynamic testing in the office with reduction of the prolapse, and she did demonstrate stress incontinence. The plan is now  to proceed with a total abdominal hysterectomy with a bilateral salpingo-oophorectomy,  abdominal sacrocolpopexy, possible Halban's culdoplasty, TVT Exact mid urethral sling,  And anterior and posterior colporrhaphy. Risks, benefits, and alternatives have been  reviewed with the patient who wishes to proceed.  FINDINGS: Examination under anesthesia revealed a third-degree uterine prolapse uterine prolapse and a first-degree rectocele.  At the time of laparotomy, the patient was noted to have a normal uterus, bilateral tubes and ovaries. There was no evidence of any adhesive  disease in the abdomen or pelvis. The appendix was unremarkable. The liver edge and gallbladder palpated to be normal. The kidneys were unremarkable as was the periaortic region.  Cystoscopy at termination of the bladder portion of the procedure documented the bladder to be intact  throughout 360 degrees including the bladder dome and trigone. There was no evidence of a foreign body in the bladder or the urethra. The ureters were patent bilaterally.  SPECIMENS: The uterus, cervix, bilateral tubes and bilateral ovaries were sent to Pathology.  PROCEDURE IN DETAIL: The patient was reidentified in the preop hold area. She did receive cefotetan 2 g IV for antibiotic prophylaxis. She received both TED hose and PAS stockings for DVT prophylaxis.  The patient was escorted to the operating room, where she was placed in the dorsal lithotomy position in [place]. General endotracheal anesthesia was induced. The patient's lower abdomen, vagina, and perineum were sterilely prepped and she was sterilely draped. The Foley catheter was placed inside the bladder and left to gravity drainage throughout and at the termination of the procedure.  The procedure began by creating a Pfannenstiel incision with a scalpel. The incision was carried down to the subcutaneous tissues using monopolar cautery for hemostasis. The fascia was incised in the midline with a scalpel and the incision was extended bilaterally using the Mayo scissors. The rectus muscles were bluntly divided in the midline. The parietal peritoneum was elevated with 2 hemostat clamps and entered sharply with the Metzenbaum scissors. The peritoneal incision was extended cranially and caudally.  An Alexis retractor was then placed inside the peritoneal cavity and the patient was placed in the Trendelenburg position. The bowel was packed into the upper abdomen with moistened lap pads.  The procedure began by placing Kelly clamps across the adnexal structures bilaterally. The left round ligament was isolated and was suture ligated with a transfixing suture of 0 Vicryl. Monopolar cautery was used to bisect the round ligament and the anterior and posterior leaves were opened with monopolar  cautery. The bladder flap was taken down anteriorly using a Metzenbaum scissors. The ureter was identified along  the patient's left pelvic sidewall. The window was created through the posterior leaf of the broad ligament on this side and the infundibulopelvic ligament was doubly clamped, sharply divided, and free tied with 0 Vicryl followed by suture ligature of the same. The same procedure that was performed on the patient's left hand side was now repeated on the patient's right hand side for isolation of the round ligament, dissection in the broad ligament, and the isolation of the infundibulopelvic ligament. Again, the ureter was identified prior to clamping the infundibulopelvic ligament.  The bladder flap was further taken down sharply with the Metzenbaum scissors. Each of the uterine arteries were skeletonized and they were then clamped, sharply divided, and suture ligated with 0 Vicryl suture. The bladder was further dissected off the cervix anteriorly and the inferior aspects of the cardinal ligaments were clamped with straight Heaney clamps, sharply divided, and suture ligated with 0 Vicryl bilaterally. Eventually, the uterosacral ligaments were reached. A clamp was placed across the left vaginal apex and the vagina was entered in this location. The pedicle was suture ligated with a transfixing suture of 0 Vicryl. The same was performed on the patient's right hand side. The Mayo scissors was used to sharply excise the cervix and the specimen from the vaginal apex. The specimen was sent to Pathology.  The vaginal cuff was closed at this time with figure-of-eight sutures of 0 Vicryl.  The pelvis was irrigated and suctioned and hemostasis was noted to be good.  The dissection for the abdominal sacrocolpopexy was performed next.  An EEA Sizer was placed in the vagina, and the peritoneum along  the anterior and posterior vaginal walls was opened and dissection  performed  on the underside of the vagina.  There was minimal mobility of the  vaginal cuff, however, and this dissection could not be carried down very far anteriorly and posteriorly.  The EEA Sizer was removed and I performed a  vaginal exam.  The patient had excellent vaginal cuff support and no evidence of vaginal cuff prolapse.  The sacrocolpopexy was therefore not performed.  The pelvis was irrigated and suctioned. Hemostasis was good. The abdomen was closed at this time. The moistened lap pads and the Alexis retractor were removed from the peritoneal cavity. The parietal peritoneum was closed with a running suture of 2-0 Vicryl. The rectus muscles were examined and there was good hemostasis. The fascia was  closed with a running suture of 0 Vicryl. The subcutaneous layer was  irrigated and suctioned and made hemostatic with monopolar cautery.  Interrupted sutures of 2-0 plain were placed in the subcutaneous layer and  the skin was closed with a subcuticular suture of 4-0 Vicryl. Dermabond was applied, and a sterile honeycomb bandage was later placed.  Attention was turned to the vaginal portion of the procedure at this point. The patient was examined and there was excellent vaginal cuff support. There was no evidence of a cystocele. There was a first-degree  rectocele noted.  Allis clamps were used to mark a 4 cm area underneath the beginning 1 cm below the urethral meatus and extending down 3 cm. The mucosa was injected locally with 1% lidocaine with epinephrine 1:100,000. The mucosa  was incised vertically in the midline with a scalpel and a combination of sharp  and blunt dissection were used to dissect this vaginal tissue and mucosa off  the underlying bladder. The dissection was carried back to the pubic rami.  A 1 cm suprapubic incision was then created  2 cm to the right and left in the midline. The Foley catheter was removed. The TVT Exact was performed  in a bottom up fashion. The urethral guide and Foley tip were placed in the urethra and the urethra was deflected properly as the TVT guide was brought through the right retropubic space and up through the right suprapubic incision. The urethra was then deflected in the opposite direction and the TVT guide was placed through the left retropubic space and through the left suprapubic ipsilateral incision.  The obturator guide was removed at this time and cystoscopy was performed and the findings were as noted above.  The bladder was drained of all cystoscopic fluid and the Foley catheter was replaced. The sling was brought up through the suprapubic incisions bilaterally. The plastic sheaths were removed as a Kelly clamp was placed between the urethra and the sling. The sling was noted to be in excellent position. Excess mesh was trimmed suprapubically. The vaginal skin edges were trimmed at this time and the anterior vaginal wall was closed with a running lock suture of 2-0 Vicryl.  The suprapubic incisions were closed at the termination of the procedure with Dermabond.  Attention was turned to the posterior vaginal wall at this time. Allis clamps were used to mark the introitus, the perineal body, and then the posterior vaginal wall mucosa. The perineal body and the posterior vaginal mucosa were injected with 1% lidocaine with epinephrine 1:100,000. A diamond shaped wedge of epithelium was excised from the perineal body and the posterior vaginal mucosa was incised vertically in the midline with the Metzenbaum scissors. Sharp and blunt dissection were used to dissect the perirectal fascia off the vaginal mucosa bilaterally. The posterior colporrhaphy was then performed with vertical mattress sutures of 0 Vicryl which reduced the rectocele. A crown stitch of 0 Vicryl were placed along the perineal body. Rectal exam confirmed the absence of sutures in the rectum. Excess  vaginal mucosa was trimmed and the posterior vaginal mucosa was closed with a running lock suture of 2-0 Vicryl. This was brought down to the hymenal ring and then under the hymen. The transverse perineal muscles were reapproximated with a running suture of this 2-0 Vicryl. The suture was then brought up the perineal body in a subcuticular fashion as for an episiotomy repair. The knot was tied at the hymen.  Vaginal examination confirmed excellent vaginal support and elevation. Final rectal exam documented the absence of sutures in the rectum. A gauze packing with Estrace cream was placed inside the vagina.  At this point, the patient was awakened and extubated, and escorted to the recovery room in stable condition. There were no complications. All needle, instrument, and sponge counts were correct.     Lenard Galloway, M.D.

## 2016-11-22 NOTE — Anesthesia Postprocedure Evaluation (Cosign Needed)
Anesthesia Post Note  Patient: Kellie Case  Procedure(s) Performed: HYSTERECTOMY ABDOMINAL WITH SALPINGO-OOPHORECTOMY (Bilateral Abdomen) POSTERIOR REPAIR (RECTOCELE) (N/A Vagina ) TRANSVAGINAL TAPE (TVT) PROCEDURE exact mid urethral sling (N/A Vagina ) CYSTOSCOPY (N/A Bladder)     Patient location during evaluation: [facility] Anesthesia Type: General Level of consciousness: awake and alert, oriented and patient cooperative Pain management: pain level controlled Vital Signs Assessment: post-procedure vital signs reviewed and stable Respiratory status: spontaneous breathing and patient connected to nasal cannula oxygen Cardiovascular status: blood pressure returned to baseline Postop Assessment: no apparent nausea or vomiting Anesthetic complications: no    Last Vitals:  Vitals:   11/22/16 1528 11/22/16 1630  BP: (!) 159/73 (!) 161/76  Pulse: 67 64  Resp: 14 16  Temp: 36.8 C 36.5 C  SpO2: 97% 97%    Last Pain:  Vitals:   11/22/16 1630  TempSrc: Oral  PainSc:    Pain Goal: Patients Stated Pain Goal: 3 (11/22/16 1528)               Levie Heritage

## 2016-11-22 NOTE — Brief Op Note (Signed)
11/22/2016  11:23 AM  PATIENT:  Kellie Case  61 y.o. female  PRE-OPERATIVE DIAGNOSIS:  complete uterovaginal prolapse, uetrine fibroids, SUI, recurrent UTI  POST-OPERATIVE DIAGNOSIS:  complete uterovaginal prolapse, uetrine fibroids, SUI, recurrent UTI  PROCEDURE:  Procedure(s): HYSTERECTOMY ABDOMINAL WITH SALPINGO-OOPHORECTOMY (Bilateral) POSTERIOR REPAIR (RECTOCELE) (N/A) TRANSVAGINAL TAPE (TVT) PROCEDURE exact mid urethral sling (N/A) CYSTOSCOPY (N/A)  SURGEON:  Surgeon(s) and Role:    * Amundson Raliegh Ip, MD - Primary    * Megan Salon, MD - Assisting  PHYSICIAN ASSISTANT:   NA  ASSISTANTS: Megan Salon, MD   ANESTHESIA:   local and general  EBL:  200 mL   IVF:  1800 cc LR.  UO:  200 cc.  BLOOD ADMINISTERED:none  DRAINS: Urinary Catheter (Foley)   LOCAL MEDICATIONS USED:  LIDOCAINE 1% WITH EPINEPHRINE 1:100,000  SPECIMEN:  Source of Specimen:  uterus, cervix, bilateral tubes and ovaries.   DISPOSITION OF SPECIMEN:  PATHOLOGY  COUNTS:  YES  TOURNIQUET:  * No tourniquets in log *  DICTATION: .Note written in EPIC  PLAN OF CARE: Admit to inpatient   PATIENT DISPOSITION:  PACU - hemodynamically stable.   Delay start of Pharmacological VTE agent (>24hrs) due to surgical blood loss or risk of bleeding: not applicable

## 2016-11-23 ENCOUNTER — Encounter (HOSPITAL_COMMUNITY): Payer: Self-pay | Admitting: Obstetrics and Gynecology

## 2016-11-23 LAB — BASIC METABOLIC PANEL
ANION GAP: 6 (ref 5–15)
BUN: 9 mg/dL (ref 6–20)
CO2: 29 mmol/L (ref 22–32)
Calcium: 8.3 mg/dL — ABNORMAL LOW (ref 8.9–10.3)
Chloride: 104 mmol/L (ref 101–111)
Creatinine, Ser: 0.57 mg/dL (ref 0.44–1.00)
GFR calc Af Amer: >60 mL/min (ref >60)
GFR calc non Af Amer: >60 mL/min (ref >60)
GLUCOSE: 108 mg/dL — AB (ref 65–99)
POTASSIUM: 4.4 mmol/L (ref 3.5–5.1)
Sodium: 139 mmol/L (ref 135–145)

## 2016-11-23 LAB — CBC
HEMATOCRIT: 36.4 % (ref 36.0–46.0)
Hemoglobin: 12.4 g/dL (ref 12.0–15.0)
MCH: 30.2 pg (ref 26.0–34.0)
MCHC: 34.1 g/dL (ref 30.0–36.0)
MCV: 88.8 fL (ref 78.0–100.0)
Platelets: 252 10*3/uL (ref 150–400)
RBC: 4.1 MIL/uL (ref 3.87–5.11)
RDW: 13 % (ref 11.5–15.5)
WBC: 12.3 10*3/uL — AB (ref 4.0–10.5)

## 2016-11-23 NOTE — Progress Notes (Signed)
Wasted 6 mL of Morphine PCA in sink with Vonzella Nipple, RN.

## 2016-11-23 NOTE — Progress Notes (Signed)
POD - 1 - Procedure(s) (LRB): HYSTERECTOMY ABDOMINAL WITH SALPINGO-OOPHORECTOMY (Bilateral) POSTERIOR REPAIR (RECTOCELE) (N/A) TRANSVAGINAL TAPE (TVT) PROCEDURE exact mid urethral sling (N/A) CYSTOSCOPY (N/A)  Subjective: Patient reports no flatus.  Foley is out.  Vaginal packing is out.  Ready to get up  Used the PCA overnight and good control of pain.   Objective: I have reviewed patient's vital signs, intake and output and labs. Vitals:   11/23/16 0600 11/23/16 0800  BP:  (!) 125/55  Pulse:  60  Resp: 10 13  Temp:  98.4 F (36.9 C)  SpO2: 98% 99%    I/O - 3927 cc/3375 cc. WBC 12.3. Hgb 12.4.  General: alert and cooperative Resp: clear to auscultation bilaterally Cardio: regular rate and rhythm, S1, S2 normal, no murmur, click, rub or gallop GI: soft, non-tender; bowel sounds normal; no masses,  no organomegaly and incision: clean, dry, intact and SP incisions intact and with minor ecchymoses. Extremities: Ted hose on. Vaginal Bleeding: minimal  Assessment: s/p Procedure(s): HYSTERECTOMY ABDOMINAL WITH SALPINGO-OOPHORECTOMY (Bilateral) POSTERIOR REPAIR (RECTOCELE) (N/A) TRANSVAGINAL TAPE (TVT) PROCEDURE exact mid urethral sling (N/A) CYSTOSCOPY (N/A): progressing well  Plabladder training. Advance diet Encourage ambulation Advance to PO medication bladder training.   LOS: 1 day    Kellie Case Matthew Saras 11/23/2016, 8:49 AM

## 2016-11-24 LAB — CBC WITH DIFFERENTIAL/PLATELET
BASOS ABS: 0 10*3/uL (ref 0.0–0.1)
BASOS PCT: 0 %
EOS ABS: 0.1 10*3/uL (ref 0.0–0.7)
EOS PCT: 1 %
HCT: 34.3 % — ABNORMAL LOW (ref 36.0–46.0)
Hemoglobin: 11.5 g/dL — ABNORMAL LOW (ref 12.0–15.0)
Lymphocytes Relative: 23 %
Lymphs Abs: 2.6 10*3/uL (ref 0.7–4.0)
MCH: 30.3 pg (ref 26.0–34.0)
MCHC: 33.5 g/dL (ref 30.0–36.0)
MCV: 90.3 fL (ref 78.0–100.0)
MONO ABS: 0.8 10*3/uL (ref 0.1–1.0)
MONOS PCT: 7 %
NEUTROS ABS: 7.9 10*3/uL — AB (ref 1.7–7.7)
Neutrophils Relative %: 69 %
PLATELETS: 225 10*3/uL (ref 150–400)
RBC: 3.8 MIL/uL — ABNORMAL LOW (ref 3.87–5.11)
RDW: 13.1 % (ref 11.5–15.5)
WBC: 11.5 10*3/uL — ABNORMAL HIGH (ref 4.0–10.5)

## 2016-11-24 MED ORDER — OXYCODONE-ACETAMINOPHEN 5-325 MG PO TABS: 1.0000 | ORAL_TABLET | ORAL | 0 refills | Status: DC | PRN

## 2016-11-24 NOTE — Discharge Instructions (Signed)
Abdominal Hysterectomy, Care After °This sheet gives you information about how to care for yourself after your procedure. Your health care provider may also give you more specific instructions. If you have problems or questions, contact your health care provider. °What can I expect after the procedure? °After your procedure, it is common to have: °· Pain. °· Fatigue. °· Poor appetite. °· Less interest in sex. °· Vaginal bleeding and discharge. You may need to use a sanitary napkin after this procedure. ° °Follow these instructions at home: °Bathing °· Do not take baths, swim, or use a hot tub until your health care provider approves. Ask your health care provider if you can take showers. You may only be allowed to take sponge baths for bathing. °· Keep the bandage (dressing) dry until your health care provider says it can be removed. °Incision care °· Follow instructions from your health care provider about how to take care of your incision. Make sure you: °? Wash your hands with soap and water before you change your bandage (dressing). If soap and water are not available, use hand sanitizer. °? Change your dressing as told by your health care provider. °? Leave stitches (sutures), skin glue, or adhesive strips in place. These skin closures may need to stay in place for 2 weeks or longer. If adhesive strip edges start to loosen and curl up, you may trim the loose edges. Do not remove adhesive strips completely unless your health care provider tells you to do that. °· Check your incision area every day for signs of infection. Check for: °? Redness, swelling, or pain. °? Fluid or blood. °? Warmth. °? Pus or a bad smell. °Activity °· Do gentle, daily exercises as told by your health care provider. You may be told to take short walks every day and go farther each time. °· Do not lift anything that is heavier than 10 lb (4.5 kg), or the limit that your health care provider tells you, until he or she says that it is  safe. °· Do not drive or use heavy machinery while taking prescription pain medicine. °· Do not drive for 24 hours if you were given a medicine to help you relax (sedative). °· Follow your health care provider's instructions about exercise, driving, and general activities. Ask your health care provider what activities are safe for you. °Lifestyle °· Do not douche, use tampons, or have sex for at least 6 weeks or as told by your health care provider. °· Do not drink alcohol until your health care provider approves. °· Drink enough fluid to keep your urine clear or pale yellow. °· Try to have someone at home with you for the first 1-2 weeks to help. °· Do not use any products that contain nicotine or tobacco, such as cigarettes and e-cigarettes. These can delay healing. If you need help quitting, ask your health care provider. °General instructions °· Take over-the-counter and prescription medicines only as told by your health care provider. °· Do not take aspirin or ibuprofen. These medicines can cause bleeding. °· To prevent or treat constipation while you are taking prescription pain medicine, your health care provider may recommend that you: °? Drink enough fluid to keep your urine clear or pale yellow. °? Take over-the-counter or prescription medicines. °? Eat foods that are high in fiber, such as fresh fruits and vegetables, whole grains, and beans. °? Limit foods that are high in fat and processed sugars, such as fried and sweet foods. °· Keep all   follow-up visits as told by your health care provider. This is important. Contact a health care provider if:  You have chills or fever.  You have redness, swelling, or pain around your incision.  You have fluid or blood coming from your incision.  Your incision feels warm to the touch.  You have pus or a bad smell coming from your incision.  Your incision breaks open.  You feel dizzy or light-headed.  You have pain or bleeding when you urinate.  You  have persistent diarrhea.  You have persistent nausea and vomiting.  You have abnormal vaginal discharge.  You have a rash.  You have any type of abnormal reaction or you develop an allergy to your medicine.  Your pain medicine does not help. Get help right away if:  You have a fever and your symptoms suddenly get worse.  You have severe abdominal pain.  You have shortness of breath.  You faint.  You have pain, swelling, or redness in your leg.  You have heavy vaginal bleeding with blood clots. Summary  After your procedure, it is common to have pain, fatigue and vaginal discharge.  Do not take baths, swim, or use a hot tub until your health care provider approves. Ask your health care provider if you can take showers. You may only be allowed to take sponge baths for bathing.  Follow your health care provider's instructions about exercise, driving, and general activities. Ask your health care provider what activities are safe for you.  Do not lift anything that is heavier than 10 lb (4.5 kg), or the limit that your health care provider tells you, until he or she says that it is safe.  Try to have someone at home with you for the first 1-2 weeks to help. This information is not intended to replace advice given to you by your health care provider. Make sure you discuss any questions you have with your health care provider. Document Released: 08/07/2004 Document Revised: 01/07/2016 Document Reviewed: 01/07/2016 Elsevier Interactive Patient Education  2017 [employer]  Urethral Vaginal Sling A urethral vaginal sling procedure is surgery to correct urinary incontinence. Urinary incontinence is uncontrolled loss of urine. It is common in women who have had children and in older women. In this surgery, a strong piece of material is placed under the tube that drains the bladder (urethra). This sling is made of tension-free vaginal tape or nylon mesh. It fits under the urethra like  a hammock. The sling is put in position to straighten, support, and hold the urethra in its normal position. Tell a health care provider about:  Any allergies you have.  All medicines you are taking, including vitamins, herbs, eye drops, creams, and over-the-counter medicines.  Any problems you or family members have had with anesthetic medicines.  Any blood disorders you have.  Any surgeries you have had.  Any medical conditions you have. What are the risks? Generally, this is a safe procedure. However, as with any procedure, complications can occur. Possible complications include:  Infection.  Excessive bleeding.  Damage to other organs.  Problems urinating properly for several days or weeks.  Problems from the use of anesthetics.  Return of the urinary incontinence.  What happens before the procedure?  Ask your health care provider about changing or stopping your regular medicines. You may need to stop taking certain medicines 1 week before the surgery.  Do not eat or drink anything for 6-8 hours before the surgery.  If you smoke,  do not smoke for at least 2 weeks before the surgery.  Make plans to have someone drive you home after your hospital stay. Also arrange for someone to help you with activities during recovery. What happens during the procedure?  You will have general or spinal anesthesia. With general anesthesia, you are asleep and will feel no pain. With spinal anesthesia, you are numb from the waist down, but you will still be awake.  A catheter is placed in your bladder to drain urine during the procedure.  An incision is made in your vagina and low on your belly in the hairline.  The sling material is passed around your bladder neck and sutured to the muscles to hold the urethra in its normal position.  The incisions are closed. What happens after the procedure?  You will be taken to a recovery area where your progress will be monitored closely.  Your breathing, blood pressure, and pulse (vital signs) will be checked often. When you are stable, you will be moved to a regular hospital room.  You will have a catheter in place to drain your bladder. This will stay in place until your bladder is working properly on its own.  You may have a gauze packing in the vagina to prevent bleeding. This will be removed in 1-2 days.  You will likely need to stay in the hospital for 2-3 days. This information is not intended to replace advice given to you by your health care provider. Make sure you discuss any questions you have with your health care provider. Document Released: 10/28/2007 Document Revised: 06/26/2015 Document Reviewed: 07/07/2012 Elsevier Interactive Patient Education  [employer].

## 2016-11-24 NOTE — Progress Notes (Signed)
2 Days Post-Op Procedure(s) (LRB): HYSTERECTOMY ABDOMINAL WITH SALPINGO-OOPHORECTOMY (Bilateral) POSTERIOR REPAIR (RECTOCELE) (N/A) TRANSVAGINAL TAPE (TVT) PROCEDURE exact mid urethral sling (N/A) CYSTOSCOPY (N/A)  Subjective: Patient reports tolerating PO, + flatus and no problems voiding.   Ambulating.  Objective: I have reviewed patient's vital signs, intake and output and labs. Vitals:   11/24/16 0001 11/24/16 0419  BP: (!) 135/51 (!) 139/55  Pulse: 85 90  Resp: 20 20  Temp: 99 F (37.2 C) 99.2 F (37.3 C)  SpO2: 96% 96%   I /O - 1105 cc/1200 cc.  WBC 11.5 HGB 11.5  General: alert and cooperative Resp: Rales at left lung base. Cardio: regular rate and rhythm, S1, S2 normal, no murmur, click, rub or gallop GI: soft, non-tender; bowel sounds normal; no masses,  no organomegaly and incision: clean, dry, intact and SP incisions intact and ecchymoses with mild induration noted. Extremities: Ted hose on. Vaginal Bleeding: minimal  Final pathology - proliferation of endometrium, fibroids, adenomyosis.  Normal cervix, tubes, and ovaries.  Assessment: s/p Procedure(s): HYSTERECTOMY ABDOMINAL WITH SALPINGO-OOPHORECTOMY (Bilateral) POSTERIOR REPAIR (RECTOCELE) (N/A) TRANSVAGINAL TAPE (TVT) PROCEDURE exact mid urethral sling (N/A) CYSTOSCOPY (N/A): progressing well and ready for discharge.  Plan: Discharge home Instructions reviewed in verbal and written form.   Rx for Percocet.  Follow up in 5 days.   LOS: 2 days    Arloa Koh 11/24/2016, 8:18 AM

## 2016-11-29 ENCOUNTER — Ambulatory Visit (INDEPENDENT_AMBULATORY_CARE_PROVIDER_SITE_OTHER): Payer: BC Managed Care – PPO | Admitting: Obstetrics and Gynecology

## 2016-11-29 ENCOUNTER — Encounter: Payer: Self-pay | Admitting: Obstetrics and Gynecology

## 2016-11-29 VITALS — BP 124/70 | HR 76 | Temp 98.2°F | Resp 16 | Wt 176.0 lb

## 2016-11-29 DIAGNOSIS — D72829 Elevated white blood cell count, unspecified: Secondary | ICD-10-CM

## 2016-11-29 DIAGNOSIS — Z9889 Other specified postprocedural states: Secondary | ICD-10-CM

## 2016-11-29 NOTE — Progress Notes (Signed)
GYNECOLOGY  VISIT   HPI: 61 y.o.   Married  Caucasian  female   [id number] with No LMP recorded. Patient has had a hysterectomy.   here for   1 week post-op.  Took only Tylenol yesterday. Not taking Percocet any longer.   Ambulating at home.   BMs are normal with Miralax once daily.   Voiding well. Less urgency.  No dysuria.   Small spotting.   Final pathology - adenomyosis, fibroids, proliferative endometrium.  Normal tubes and ovaries.  GYNECOLOGIC HISTORY: No LMP recorded. Patient has had a hysterectomy. Contraception:  Hysterectomy Menopausal hormone therapy:  none Last mammogram:  11-02-16 BIRADS 1 negative/density b Last pap smear:   09/01/15 Negative        OB History    Gravida Para Term Preterm AB Living   3 3 0 0 0 3   SAB TAB Ectopic Multiple Live Births   0 0 0 0 0         Patient Active Problem List   Diagnosis Date Noted  . 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 Complete uterovaginal prolapse 11/05/2016  . Recurrent UTI 10/19/2016    Past Medical History:  Diagnosis Date  . History of kidney stones    2012  . Hyperlipidemia 2018   TG - 180, LDL - 132  . Hypothyroidism   . Thyroid disease   . Urinary incontinence     Past Surgical History:  Procedure Laterality Date  . ANTERIOR AND POSTERIOR REPAIR N/A 11/22/2016   Procedure: POSTERIOR REPAIR (RECTOCELE);  Surgeon: Nunzio Cobbs, MD;  Location: [facility];  Service: Gynecology;  Laterality: N/A;  . BLADDER SUSPENSION N/A 11/22/2016   Procedure: TRANSVAGINAL TAPE (TVT) PROCEDURE exact mid urethral sling;  Surgeon: Nunzio Cobbs, MD;  Location: [facility];  Service: Gynecology;  Laterality: N/A;  . BUNIONECTOMY    . CYSTOSCOPY N/A 11/22/2016   Procedure: CYSTOSCOPY;  Surgeon: Nunzio Cobbs, MD;  Location: [facility];  Service: Gynecology;  Laterality: N/A;  . TONSILLECTOMY      Current Outpatient Prescriptions   Medication Sig Dispense Refill  . acetaminophen (TYLENOL) 500 MG tablet Take 500 mg by mouth as needed.    Marland Kitchen CALCIUM PO Take 1 tablet by mouth daily.     Marland Kitchen levothyroxine (SYNTHROID) 75 MCG tablet Take 75 mcg by mouth daily.     . Multiple Vitamin (MULTIVITAMIN) tablet Take 1 tablet by mouth daily.    [name] Glycol-Propyl Glycol (SYSTANE OP) Place 1 drop into both eyes every morning.    . dicyclomine (BENTYL) 20 MG tablet Take 20 mg by mouth daily as needed for spasms.     Marland Kitchen oxyCODONE-acetaminophen (PERCOCET/ROXICET) 5-325 MG tablet as needed.     No current facility-administered medications for this visit.      ALLERGIES: Nsaids  Family History  Problem Relation Age of Onset  . Multiple sclerosis Mother   . Liver cancer Maternal Grandmother   . Heart attack Maternal Grandfather   . Hypertension Maternal Grandfather     Social History   Social History  . Marital status: Married    Spouse name: N/A  . Number of children: N/A  . Years of education: N/A   Occupational History  . Not on file.   Social History Main Topics  . Smoking status: Never Smoker  . Smokeless tobacco: Never Used  . Alcohol use 0.6 oz/week    1 Glasses  of wine per week     Comment: occasional  . Drug use: No  . Sexual activity: Yes    Birth control/ protection: Post-menopausal, Surgical     Comment: Hysterectomy   Other Topics Concern  . Not on file   Social History Narrative  . No narrative on file    ROS:  Pertinent items are noted in HPI.  PHYSICAL EXAMINATION:    BP 124/70 (BP Location: Right Arm, Patient Position: Sitting, Cuff Size: Normal)   Pulse 76   Temp 98.2 F (36.8 C) (Oral)   Resp 16   Wt 176 lb (79.8 kg)   BMI 31.68 kg/m     General appearance: alert, cooperative and appears stated age   Abdomen: Pfannenstiel incision intact, soft, non-tender, no masses,  no organomegaly.  Ecchymoses and slight induration above incision.   Pelvic: External genitalia:  Ecchymoses  of SP region with incisions intact.              Urethra:  normal appearing urethra with no masses, tenderness or lesions              Bartholins and Skenes: normal                           Bimanual Exam:  Uterus:  Absent.  Excellent vaginal cuff support.              Adnexa: no mass, fullness, tenderness          Chaperone was present for exam.  ASSESSMENT  Status post TAH/BSO/TVT Exact midurethral sling and cysto/posterior colporrhaphy. Elevated WBC postop.   PLAN  Check CBC with diff.  Continue decreased activity.  Reviewed surgical procedure and final pathology report.  Follow up for 6 week post op visit.    An After Visit Summary was printed and given to the patient.

## 2016-11-30 LAB — CBC WITH DIFFERENTIAL/PLATELET
Basophils Absolute: 0 10*3/uL (ref 0.0–0.2)
Basos: 0 %
EOS (ABSOLUTE): 0.2 10*3/uL (ref 0.0–0.4)
EOS: 2 %
HEMATOCRIT: 39.4 % (ref 34.0–46.6)
Hemoglobin: 13 g/dL (ref 11.1–15.9)
Immature Grans (Abs): 0 10*3/uL (ref 0.0–0.1)
Immature Granulocytes: 0 %
LYMPHS ABS: 1.9 10*3/uL (ref 0.7–3.1)
Lymphs: 21 %
MCH: 29.8 pg (ref 26.6–33.0)
MCHC: 33 g/dL (ref 31.5–35.7)
MCV: 90 fL (ref 79–97)
MONOS ABS: 0.8 10*3/uL (ref 0.1–0.9)
Monocytes: 9 %
Neutrophils Absolute: 6.1 10*3/uL (ref 1.4–7.0)
Neutrophils: 68 %
Platelets: 366 10*3/uL (ref 150–379)
RBC: 4.36 x10E6/uL (ref 3.77–5.28)
RDW: 13.4 % (ref 12.3–15.4)
WBC: 9.1 10*3/uL (ref 3.4–10.8)

## 2016-12-02 NOTE — Discharge Summary (Signed)
Physician Discharge Summary  Patient ID: Kellie Case MRN: [id number] DOB/AGE: March 25, 1955 61 y.o.  Admit date: 11/22/2016 Discharge date:  11/24/16 Admission Diagnoses:   1.  Complete uterovaginal prolapse. 2.  Genuine stress incontinence.  Discharge Diagnoses:  1.  Complete uterovaginal prolapse with cervical elongation. 2.  Genuine stress incontinence. 3.  Status post total abdominal hysterectomy with bilateral salpingo-oophorectomy, TVT Exact midurethral sling with cystoscopy, and posterior colporrhaphy.  Active Problems:   Status post total abdominal hysterectomy and bilateral salpingo-oophorectomy   Discharged Condition: good  Hospital Course:  The patient was admitted on 11/22/16 for surgical care of uterovaginal prolapse and urinary stress incontinence and had a total abdominal hysterectomy with bilateral salpingo-oophorectomy, TVT Exact midurethral sling with cystoscopy, and posterior colporrhaphy. which were performed without complication while under general anesthesia.  Preoperatively, there was a plan for an abdominal sacrocolpopexy.  Intraoperatively, however, the patient was noted to have minimal prolapse of the vaginal vault after the hysterectomy was performed. The sacrocolpopexy was therefore not performed.  The patient's post op course was uneventful.  She had a morphine PCA for pain control initially, and this was converted over to Percocet on post op day one when the patient began taking po well.  She ambulated independently and wore PAS and Ted hose for DVT prophylaxis while in bed.  Her foley catheter were removed on post op day one, and she voided good volumes. The patient's vital signs remained stable and she demonstrated no signs of infection during her hospitalization.  The patient's post op day one Hgb was 12.4, and her WBC was 12.3.  Her post op day two Hgb was 11.5, and her WBC was 11.5.  The elevated WBC was attributed to surgical dissection.  The patient  demonstrated no signs of clinical infection during her hospitalization.  She had very minimal vaginal bleeding, and her incision(s) demonstrated no signs of erythema or significant drainage.  She was found to be in good condition and ready for discharge on post op day  Two.  Her final pathology report demonstrated proliferation of the endometrium, fibroids and adenomyosis of the uterus. The bilateral tubes and ovaries were normal.  Consults: None  Significant Diagnostic Studies:  See Hospital Course.  Treatments: surgery:  total abdominal hysterectomy with bilateral salpingo-oophorectomy, TVT Exact midurethral sling with cystoscopy, and posterior colporrhaphy on 11/22/16.  Discharge Exam: Blood pressure (!) 136/55, pulse 99, temperature 99.2 F (37.3 C), temperature source Oral, resp. rate 20, height 5' 2.5" (1.588 m), weight 178 lb (80.7 kg), SpO2 95 %. General: alert and cooperative Resp: Rales at left lung base. Cardio: regular rate and rhythm, S1, S2 normal, no murmur, click, rub or gallop GI: soft, non-tender; bowel sounds normal; no masses,  no organomegaly and incision: clean, dry, intact and SP incisions intact and ecchymoses with mild induration noted. Extremities: Ted hose on. Vaginal Bleeding: minimal  Disposition: 01-Home or Self Care Discharge instructions were reviewed in verbal and written form.   Allergies as of 11/24/2016      Reactions   Nsaids Itching   Not certain which NSAID she developed a rash and itching to.      Medication List    STOP taking these medications   AZO-CRANBERRY PO   conjugated estrogens vaginal cream Commonly known as:  PREMARIN   nitrofurantoin (macrocrystal-monohydrate) 100 MG capsule Commonly known as:  MACROBID     TAKE these medications   CALCIUM PO Take 1 tablet by mouth daily.   dicyclomine 20 MG tablet  Commonly known as:  BENTYL Take 20 mg by mouth daily as needed for spasms.   multivitamin tablet Take 1 tablet by mouth  daily.   SYNTHROID 75 MCG tablet Generic drug:  levothyroxine Take 75 mcg by mouth daily.   SYSTANE OP Place 1 drop into both eyes every morning.      Follow-up Information    Nunzio Cobbs, MD In 5 days.   Specialty:  Obstetrics and Gynecology Contact information: [address] [place] [address] [phone]           Signed: Arloa Koh 12/02/2016, 4:58 AM

## 2017-01-03 ENCOUNTER — Other Ambulatory Visit: Payer: Self-pay

## 2017-01-03 ENCOUNTER — Encounter: Payer: Self-pay | Admitting: Obstetrics and Gynecology

## 2017-01-03 ENCOUNTER — Ambulatory Visit (INDEPENDENT_AMBULATORY_CARE_PROVIDER_SITE_OTHER): Payer: BC Managed Care – PPO | Admitting: Obstetrics and Gynecology

## 2017-01-03 VITALS — BP 122/70 | HR 80 | Resp 16 | Wt 175.0 lb

## 2017-01-03 DIAGNOSIS — Z9889 Other specified postprocedural states: Secondary | ICD-10-CM

## 2017-01-03 NOTE — Progress Notes (Signed)
GYNECOLOGY  VISIT  HPI: 61 y.o.   Married  Caucasian  female   G3P0003 with No LMP recorded. Patient has had a hysterectomy.   here for   6 week post op HYSTERECTOMY ABDOMINAL WITH SALPINGO-OOPHORECTOMY (Bilateral Abdomen) POSTERIOR REPAIR (RECTOCELE) (N/A Vagina ) TRANSVAGINAL TAPE (TVT) PROCEDURE exact mid urethral sling (N/A Vagina ) CYSTOSCOPY (N/A Bladder)    Expressed her appreciation for her surgical care.   May feel a twinge on the right or left lower quadrant with rolling over in bed.  No pain.   Bladder and bowel function is normal.  No urinary incontinence. Bladder emptying well.   No dysuria.   Walking but not exercising.   Has Premarin cream at home.   GYNECOLOGIC HISTORY: No LMP recorded. Patient has had a hysterectomy. Contraception:  Hysterectomy Menopausal hormone therapy:  none Last mammogram:   11-02-16 BIRADS 1 negative/density b Last pap smear:   09/01/15 Negative        OB History    Gravida Para Term Preterm AB Living   3 3 0 0 0 3   SAB TAB Ectopic Multiple Live Births   0 0 0 0 0         Patient Active Problem List   Diagnosis Date Noted  . 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 Complete uterovaginal prolapse 11/05/2016  . Recurrent UTI 10/19/2016    Past Medical History:  Diagnosis Date  . History of kidney stones    2012  . Hyperlipidemia 2018   TG - 180, LDL - 132  . Hypothyroidism   . Thyroid disease   . Urinary incontinence     Past Surgical History:  Procedure Laterality Date  . ANTERIOR AND POSTERIOR REPAIR N/A 11/22/2016   Procedure: POSTERIOR REPAIR (RECTOCELE);  Surgeon: Nunzio Cobbs, MD;  Location: [facility];  Service: Gynecology;  Laterality: N/A;  . BLADDER SUSPENSION N/A 11/22/2016   Procedure: TRANSVAGINAL TAPE (TVT) PROCEDURE exact mid urethral sling;  Surgeon: Nunzio Cobbs, MD;  Location: [facility];  Service: Gynecology;  Laterality: N/A;  .  BUNIONECTOMY    . CYSTOSCOPY N/A 11/22/2016   Procedure: CYSTOSCOPY;  Surgeon: Nunzio Cobbs, MD;  Location: [facility];  Service: Gynecology;  Laterality: N/A;  . TONSILLECTOMY      Current Outpatient Medications  Medication Sig Dispense Refill  . acetaminophen (TYLENOL) 500 MG tablet Take 500 mg by mouth as needed.    Marland Kitchen CALCIUM PO Take 1 tablet by mouth daily.     Marland Kitchen dicyclomine (BENTYL) 20 MG tablet Take 20 mg by mouth daily as needed for spasms.     Marland Kitchen levothyroxine (SYNTHROID) 75 MCG tablet Take 75 mcg by mouth daily.     . Multiple Vitamin (MULTIVITAMIN) tablet Take 1 tablet by mouth daily.    [name] Glycol-Propyl Glycol (SYSTANE OP) Place 1 drop into both eyes every morning.     No current facility-administered medications for this visit.      ALLERGIES: Nsaids  Family History  Problem Relation Age of Onset  . Multiple sclerosis Mother   . Liver cancer Maternal Grandmother   . Heart attack Maternal Grandfather   . Hypertension Maternal Grandfather     Social History   Socioeconomic History  . Marital status: Married    Spouse name: Not on file  . Number of children: Not on file  . Years of education: Not on file  . Highest  education level: Not on file  Social Needs  . Financial resource strain: Not on file  . Food insecurity - worry: Not on file  . Food insecurity - inability: Not on file  . Transportation needs - medical: Not on file  . Transportation needs - non-medical: Not on file  Occupational History  . Not on file  Tobacco Use  . Smoking status: Never Smoker  . Smokeless tobacco: Never Used  Substance and Sexual Activity  . Alcohol use: Yes    Alcohol/week: 0.6 oz    Types: 1 Glasses of wine per week    Comment: occasional  . Drug use: No  . Sexual activity: Not Currently    Birth control/protection: Post-menopausal, Surgical    Comment: Hysterectomy  Other Topics Concern  . Not on file  Social History Narrative  . Not on file     ROS:  Pertinent items are noted in HPI.  PHYSICAL EXAMINATION:    BP 122/70 (BP Location: Right Arm, Patient Position: Sitting, Cuff Size: Normal)   Pulse 80   Resp 16   Wt 175 lb (79.4 kg)   BMI 31.50 kg/m     General appearance: alert, cooperative and appears stated age   Abdomen: incision intact.  Abdomen is soft, non-tender, no masses,  no organomegaly   Pelvic: External genitalia:  no lesions              Urethra:  normal appearing urethra with no masses, tenderness or lesions              Bartholins and Skenes: normal                 Vagina: normal appearing vagina with normal color and discharge, no lesions.  No suture, mesh or sling visible.              Cervix:  Absent.  Cuff intact.                 Bimanual Exam:  Uterus:   Absent. No mesh or sling palpable.              Adnexa: no mass, fullness, tenderness            Chaperone was present for exam.  ASSESSMENT   Doing well post op.   PLAN  Continue decreased activity - no sexual intercourse and no lifting over 10 pounds for a total of 3 months post op.  Start Premarin cream 1/2 gram pv at hs twice weekly.  Follow up for final 3 month post op check.    An After Visit Summary was printed and given to the patient.

## 2017-01-03 NOTE — Patient Instructions (Signed)
Please use Premarin vaginal cream, 1/2 gram per vagina at bed time twice a week.

## 2017-01-04 ENCOUNTER — Encounter: Payer: Self-pay | Admitting: Obstetrics and Gynecology

## 2017-02-09 NOTE — Progress Notes (Deleted)
GYNECOLOGY  VISIT   HPI: 62 y.o.   Married  Caucasian  female   G3P0003 with No LMP recorded. Patient has had a hysterectomy.   here for 12 week follow up HYSTERECTOMY ABDOMINAL WITH SALPINGO-OOPHORECTOMY (Bilateral Abdomen) POSTERIOR REPAIR (RECTOCELE) (N/A Vagina ) TRANSVAGINAL TAPE (TVT) PROCEDURE exact mid urethral sling (N/A Vagina ) CYSTOSCOPY (N/A Bladder).     GYNECOLOGIC HISTORY: No LMP recorded. Patient has had a hysterectomy. Contraception:  Hysterectomy Menopausal hormone therapy:  none Last mammogram: 10-2-18BIRADS 1 negative/density b Last pap smear: 09-01-15 Neg        OB History    Gravida Para Term Preterm AB Living   3 3 0 0 0 3   SAB TAB Ectopic Multiple Live Births   0 0 0 0 0         Patient Active Problem List   Diagnosis Date Noted  . 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 Recurrent UTI 10/19/2016    Past Medical History:  Diagnosis Date  . History of kidney stones    2012  . Hyperlipidemia 2018   TG - 180, LDL - 132  . Hypothyroidism   . Thyroid disease   . Urinary incontinence     Past Surgical History:  Procedure Laterality Date  . ANTERIOR AND POSTERIOR REPAIR N/A 11/22/2016   Procedure: POSTERIOR REPAIR (RECTOCELE);  Surgeon: Nunzio Cobbs, MD;  Location: [facility];  Service: Gynecology;  Laterality: N/A;  . BLADDER SUSPENSION N/A 11/22/2016   Procedure: TRANSVAGINAL TAPE (TVT) PROCEDURE exact mid urethral sling;  Surgeon: Nunzio Cobbs, MD;  Location: [facility];  Service: Gynecology;  Laterality: N/A;  . BUNIONECTOMY    . CYSTOSCOPY N/A 11/22/2016   Procedure: CYSTOSCOPY;  Surgeon: Nunzio Cobbs, MD;  Location: [facility];  Service: Gynecology;  Laterality: N/A;  . TONSILLECTOMY      Current Outpatient Medications  Medication Sig Dispense Refill  . acetaminophen (TYLENOL) 500 MG tablet Take 500 mg by mouth as needed.    Marland Kitchen CALCIUM PO Take 1 tablet by mouth  daily.     Marland Kitchen dicyclomine (BENTYL) 20 MG tablet Take 20 mg by mouth daily as needed for spasms.     Marland Kitchen levothyroxine (SYNTHROID) 75 MCG tablet Take 75 mcg by mouth daily.     . Multiple Vitamin (MULTIVITAMIN) tablet Take 1 tablet by mouth daily.    [name] Glycol-Propyl Glycol (SYSTANE OP) Place 1 drop into both eyes every morning.     No current facility-administered medications for this visit.      ALLERGIES: Nsaids  Family History  Problem Relation Age of Onset  . Multiple sclerosis Mother   . Liver cancer Maternal Grandmother   . Heart attack Maternal Grandfather   . Hypertension Maternal Grandfather     Social History   Socioeconomic History  . Marital status: Married    Spouse name: Not on file  . Number of children: Not on file  . Years of education: Not on file  . Highest education level: Not on file  Social Needs  . Financial resource strain: Not on file  . Food insecurity - worry: Not on file  . Food insecurity - inability: Not on file  . Transportation needs - medical: Not on file  . Transportation needs - non-medical: Not on file  Occupational History  . Not on file  Tobacco Use  . Smoking status: Never Smoker  . Smokeless tobacco:  Never Used  Substance and Sexual Activity  . Alcohol use: Yes    Alcohol/week: 0.6 oz    Types: 1 Glasses of wine per week    Comment: occasional  . Drug use: No  . Sexual activity: Not Currently    Birth control/protection: Post-menopausal, Surgical    Comment: Hysterectomy  Other Topics Concern  . Not on file  Social History Narrative  . Not on file    ROS:  Pertinent items are noted in HPI.  PHYSICAL EXAMINATION:    There were no vitals taken for this visit.    General appearance: alert, cooperative and appears stated age Head: Normocephalic, without obvious abnormality, atraumatic Neck: no adenopathy, supple, symmetrical, trachea midline and thyroid normal to inspection and palpation Lungs: clear to  auscultation bilaterally Breasts: normal appearance, no masses or tenderness, No nipple retraction or dimpling, No nipple discharge or bleeding, No axillary or supraclavicular adenopathy Heart: regular rate and rhythm Abdomen: soft, non-tender, no masses,  no organomegaly Extremities: extremities normal, atraumatic, no cyanosis or edema Skin: Skin color, texture, turgor normal. No rashes or lesions Lymph nodes: Cervical, supraclavicular, and axillary nodes normal. No abnormal inguinal nodes palpated Neurologic: Grossly normal  Pelvic: External genitalia:  no lesions              Urethra:  normal appearing urethra with no masses, tenderness or lesions              Bartholins and Skenes: normal                 Vagina: normal appearing vagina with normal color and discharge, no lesions              Cervix: no lesions                Bimanual Exam:  Uterus:  normal size, contour, position, consistency, mobility, non-tender              Adnexa: no mass, fullness, tenderness              Rectal exam: {yes no:314532}.  Confirms.              Anus:  normal sphincter tone, no lesions  Chaperone was present for exam.  ASSESSMENT     PLAN     An After Visit Summary was printed and given to the patient.  ______ minutes face to face time of which over 50% was spent in counseling.

## 2017-02-14 ENCOUNTER — Ambulatory Visit: Payer: BC Managed Care – PPO | Admitting: Obstetrics and Gynecology

## 2017-02-15 ENCOUNTER — Encounter: Payer: Self-pay | Admitting: Obstetrics & Gynecology

## 2017-02-15 ENCOUNTER — Other Ambulatory Visit: Payer: Self-pay

## 2017-02-15 ENCOUNTER — Ambulatory Visit (INDEPENDENT_AMBULATORY_CARE_PROVIDER_SITE_OTHER): Payer: BC Managed Care – PPO | Admitting: Obstetrics & Gynecology

## 2017-02-15 VITALS — BP 142/76 | HR 68 | Resp 16 | Wt 177.0 lb

## 2017-02-15 DIAGNOSIS — E669 Obesity, unspecified: Secondary | ICD-10-CM | POA: Insufficient documentation

## 2017-02-15 DIAGNOSIS — Z9889 Other specified postprocedural states: Secondary | ICD-10-CM

## 2017-02-15 DIAGNOSIS — E039 Hypothyroidism, unspecified: Secondary | ICD-10-CM | POA: Insufficient documentation

## 2017-02-15 DIAGNOSIS — K589 Irritable bowel syndrome without diarrhea: Secondary | ICD-10-CM | POA: Insufficient documentation

## 2017-02-15 NOTE — Progress Notes (Signed)
Post Operative Visit  Procedure: HYSTERECTOMY ABDOMINAL WITH SALPINGO-OOPHORECTOMY (Bilateral Abdomen) POSTERIOR REPAIR (RECTOCELE) (N/A Vagina ) TRANSVAGINAL TAPE (TVT) PROCEDURE exact mid urethral sling (N/A Vagina ) CYSTOSCOPY (N/A Bladder) Days Post-op: 84  Subjective: Patient states that she is feeling great with no complaints. Patient would like to discuss directions for Premarin cream as well as post-op restrictions.  Bowel and bladder function normal.  No pain.  No vaginal discharge or bleeding.  No urinary leakage.  Objective: BP (!) 142/76 (BP Location: Right Arm, Patient Position: Sitting, Cuff Size: Large)   Pulse 68   Resp 16   Wt 177 lb (80.3 kg)   BMI 31.86 kg/m   EXAM General: alert and cooperative Resp: clear to auscultation bilaterally Cardio: regular rate and rhythm, S1, S2 normal, no murmur, click, rub or gallop GI: soft, non-tender; bowel sounds normal; no masses,  no organomegaly and incision: clean, dry and intact Extremities: extremities normal, atraumatic, no cyanosis or edema Vaginal Bleeding: none  Gyn:  NAEFG, vaginal without lesions, good apical support, mild right paravaginal defect, no cystocele  Assessment: s/p TAH/BSO, posterior repair, TVT, cystoscopy  Plan: Doing well.  Recheck with Dr. Quincy Simmonds one year.  Knows to not do any lifting ever over 15 pounds.  Pt can resume SA and all normal activities.

## 2017-05-31 ENCOUNTER — Telehealth: Payer: Self-pay | Admitting: Obstetrics and Gynecology

## 2017-05-31 NOTE — Telephone Encounter (Signed)
Patient had surgery in October and would like to know what type of mesh was used.

## 2017-05-31 NOTE — Telephone Encounter (Signed)
She had a TVT Exact midurethral sling by [employer].  This is a polypropylene permanent mesh.

## 2017-05-31 NOTE — Telephone Encounter (Signed)
Spoke with patient. Advised as seen below per Dr. Quincy Simmonds.   1. Patient asking if this mesh is part of recent recall?   2. Denies any current symptoms. Asking for precautions for future, what to look for?  Advised will review with Dr. Quincy Simmonds and return call.     Dr. Quincy Simmonds -per review of [email or web address], recent recall dated 05/17/17 for manufactures of  [facility] surgical mesh was recalled. [employer] is manufactured by [employer].

## 2017-05-31 NOTE — Telephone Encounter (Signed)
Routing to Dr. Quincy Simmonds -please advise?

## 2017-06-01 NOTE — Telephone Encounter (Signed)
Patient called to follow up with Sharee Pimple.

## 2017-06-01 NOTE — Telephone Encounter (Signed)
Patient's TVT mesh was not recalled.  She does have a permanent mesh under her midurethral. She can read the FDA recall by searching in the Internet for urogynecology surgical mesh implants from the Food and Drug Administration.  Mesh erosions and exposures can cause vaginal bleeding or discharge, pelvic pain, pain with intercourse, groin pain (in some mesh placements but not likely with hers), recurrent bladder infections, blood in the urine.  If she has concerns, I welcome her to do an office visit with me.

## 2017-06-01 NOTE — Telephone Encounter (Signed)
Left message to call Sharee Pimple at [phone].  Also tried home number on file, no answer, Left message to call Sharee Pimple at [phone].

## 2017-06-01 NOTE — Telephone Encounter (Signed)
Spoke with patient. Advised awaiting response from Dr. Quincy Simmonds.   1. Patient states she is anxious, has reviewed recent recall, "did not appear to be the same as what I had". Advised of info per [email or web address], again advised Dr. Quincy Simmonds will review and I will return call.  2. Patient asking if she needs yearly AEX? Advised yearly AEX still recommended after hysterectomy. AEX includes pelvic exam, breast, bone health to name a few.   3. Patient reports intermittent muscle pain in groin with exercise, sometimes on the right, sometimes the left. Participates in [employer], yoga and pickle ball regularly, feels this is r/t exercise. Denies any other symptoms.   Offered OV with Dr. Quincy Simmonds, patient declined at this time. Patient will await response from Dr. Quincy Simmonds. Patient thankful for update.   Routing to Dr. Quincy Simmonds

## 2017-06-02 NOTE — Telephone Encounter (Signed)
Left message to call Levester Waldridge at [phone].  

## 2017-06-03 NOTE — Telephone Encounter (Signed)
Spoke with patient and reviewed with her all the information below which Dr.Silva had provided regarding things to look for if there were a mesh erosion. She was thankful for this information and will call if she has any problems. She thanked [place] for all our time.

## 2017-06-03 NOTE — Telephone Encounter (Signed)
Patient returning Jill's call.  °

## 2017-08-09 ENCOUNTER — Telehealth: Payer: Self-pay | Admitting: Obstetrics and Gynecology

## 2017-08-09 NOTE — Telephone Encounter (Signed)
Spoke with patient. Patient has already left to travel out of town, declines OV for today, request appt for 7/10. OV scheduled with Dr. Quincy Simmonds for 7/10 at [facility] to provider for final review. Patient is agreeable to disposition. Will close encounter.

## 2017-08-09 NOTE — Telephone Encounter (Signed)
Spoke with Leonarda Salon, PA. Was advised she is mobile and is unable to access lab results at this time. Was advised urine culture is sent through [facility], went out on 08/08/17.

## 2017-08-09 NOTE — Telephone Encounter (Signed)
Have her come in to the office now and I will be happy to see her.

## 2017-08-09 NOTE — Telephone Encounter (Signed)
Spoke with patient. See at [facility] [phone] on 7/6  For UTI while vacationing. Was started on Cipro bid x3 days. Has completed medication, symptoms have improved, not resolved.  Reports frequency and burning with urination. Took Azo for 2 days. Urine culture has not returned, was advised she may not have these results until 7/11.   Patient is leaving to go out of town tonight, will return on 7/10. Asking if OV needed or await culture results? Advised will review with Dr. Quincy Simmonds and return call, patient agreeable.   Dr. Quincy Simmonds -please advise?

## 2017-08-09 NOTE — Telephone Encounter (Signed)
Patient called advised she was out of town last week and was seen by a provider while out of town for a UTI. Patient states she was prescribed Cipro for 3 days. Patient states she has completed the medication but states she is still having symptoms and adds, "I know I still have an infection".   Patient states she had a urine culture at the the clinic where she was treatedt, but does not have those results. Patient is asking should she wait until she receives culture results to schedule an appointment or can she schedule before the results are available.  Routing to Triage Nurse for review

## 2017-08-10 ENCOUNTER — Ambulatory Visit: Payer: BC Managed Care – PPO | Admitting: Obstetrics and Gynecology

## 2017-08-10 ENCOUNTER — Encounter: Payer: Self-pay | Admitting: Obstetrics and Gynecology

## 2017-08-10 ENCOUNTER — Other Ambulatory Visit: Payer: Self-pay

## 2017-08-10 VITALS — BP 148/76 | HR 80 | Temp 98.1°F | Resp 16 | Ht 61.75 in | Wt 179.0 lb

## 2017-08-10 DIAGNOSIS — R350 Frequency of micturition: Secondary | ICD-10-CM | POA: Diagnosis not present

## 2017-08-10 LAB — POCT URINALYSIS DIPSTICK
Bilirubin, UA: NEGATIVE
Blood, UA: NEGATIVE
Glucose, UA: NEGATIVE
Ketones, UA: NEGATIVE
Leukocytes, UA: NEGATIVE
NITRITE UA: NEGATIVE
PROTEIN UA: NEGATIVE
Urobilinogen, UA: 0.2 E.U./dL
pH, UA: 5 (ref 5.0–8.0)

## 2017-08-10 NOTE — Progress Notes (Signed)
GYNECOLOGY  VISIT   HPI: 62 y.o.   Married  Caucasian  female   G3P0003 with No LMP recorded (lmp unknown). Patient has had a hysterectomy.   here for UTI. Patient went to [facility] on Saturday 08/06/17 for symptoms of urinary frequency, dysuria, burning, and urgency. Patient was prescribed Cipro and has completed course. Her urine dip - showed 3+ leukocytes, neg nitrites, neg blood, urobilinogen, + protein, 3+ ketones. The UC is due back by tomorrow.   Having some urgency.  Burning is now almost gone. She is improved since yesterday.   Voiding often.   Taking AZO.  UTIs have cleared up since surgery.   No blood in the urine or pain with intercourse.   Can leak a little bit with exercise class.  This is a significant improvement over prior to surgery.   Urine dip - negative.   GYNECOLOGIC HISTORY: No LMP recorded (lmp unknown). Patient has had a hysterectomy. Contraception:  Hysterectomy Menopausal hormone therapy:  None Last mammogram:  10-2-18BIRADS 1 negative/density b Last pap smear:   09/01/15 Negative        OB History    Gravida  3   Para  3   Term  0   Preterm  0   AB  0   Living  3     SAB  0   TAB  0   Ectopic  0   Multiple  0   Live Births  0              Patient Active Problem List   Diagnosis Date Noted  . Hypothyroidism 02/15/2017  . Irritable bowel syndrome 02/15/2017  . Obesity with body mass index 30 or greater 02/15/2017  . 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 Recurrent UTI 10/19/2016    Past Medical History:  Diagnosis Date  . History of kidney stones    2012  . Hyperlipidemia 2018   TG - 180, LDL - 132  . Hypothyroidism   . Thyroid disease   . Urinary incontinence     Past Surgical History:  Procedure Laterality Date  . ANTERIOR AND POSTERIOR REPAIR N/A 11/22/2016   Procedure: POSTERIOR REPAIR (RECTOCELE);  Surgeon: Nunzio Cobbs, MD;   Location: [facility];  Service: Gynecology;  Laterality: N/A;  . BLADDER SUSPENSION N/A 11/22/2016   Procedure: TRANSVAGINAL TAPE (TVT) PROCEDURE exact mid urethral sling;  Surgeon: Nunzio Cobbs, MD;  Location: [facility];  Service: Gynecology;  Laterality: N/A;  . BUNIONECTOMY    . CYSTOSCOPY N/A 11/22/2016   Procedure: CYSTOSCOPY;  Surgeon: Nunzio Cobbs, MD;  Location: [facility];  Service: Gynecology;  Laterality: N/A;  . TONSILLECTOMY      Current Outpatient Medications  Medication Sig Dispense Refill  . acetaminophen (TYLENOL) 500 MG tablet Take 500 mg by mouth as needed.    Marland Kitchen CALCIUM PO Take 1 tablet by mouth daily.     Marland Kitchen dicyclomine (BENTYL) 20 MG tablet Take 20 mg by mouth daily as needed for spasms.     . Estrogens, Conjugated (PREMARIN VA) Place vaginally. Place 0.5 g vaginally    . levothyroxine (SYNTHROID) 75 MCG tablet Take 75 mcg by mouth daily.     . Multiple Vitamin (MULTIVITAMIN) tablet Take 1 tablet by mouth daily.    [name] Glycol-Propyl Glycol (SYSTANE OP) Place 1 drop into both eyes every morning.     No current  facility-administered medications for this visit.      ALLERGIES: Nsaids  Family History  Problem Relation Age of Onset  . Multiple sclerosis Mother   . Liver cancer Maternal Grandmother   . Heart attack Maternal Grandfather   . Hypertension Maternal Grandfather     Social History   Socioeconomic History  . Marital status: Married    Spouse name: Not on file  . Number of children: Not on file  . Years of education: Not on file  . Highest education level: Not on file  Occupational History  . Not on file  Social Needs  . Financial resource strain: Not on file  . Food insecurity:    Worry: Not on file    Inability: Not on file  . Transportation needs:    Medical: Not on file    Non-medical: Not on file  Tobacco Use  . Smoking status: Never Smoker  . Smokeless tobacco: Never Used  Substance and Sexual Activity  .  Alcohol use: Yes    Alcohol/week: 0.6 oz    Types: 1 Glasses of wine per week    Comment: occasional  . Drug use: No  . Sexual activity: Yes    Birth control/protection: Post-menopausal, Surgical    Comment: Hysterectomy  Lifestyle  . Physical activity:    Days per week: Not on file    Minutes per session: Not on file  . Stress: Not on file  Relationships  . Social connections:    Talks on phone: Not on file    Gets together: Not on file    Attends religious service: Not on file    Active member of club or organization: Not on file    Attends meetings of clubs or organizations: Not on file    Relationship status: Not on file  . Intimate partner violence:    Fear of current or ex partner: Not on file    Emotionally abused: Not on file    Physically abused: Not on file    Forced sexual activity: Not on file  Other Topics Concern  . Not on file  Social History Narrative  . Not on file    Review of Systems  Constitutional: Negative.   HENT: Negative.   Eyes: Negative.   Respiratory: Negative.   Cardiovascular: Negative.   Gastrointestinal: Negative.   Endocrine: Negative.   Genitourinary: Positive for frequency and urgency.       Urinary burning  Musculoskeletal: Negative.   Skin: Negative.   Allergic/Immunologic: Negative.   Neurological: Negative.   Hematological: Negative.   Psychiatric/Behavioral: Negative.     PHYSICAL EXAMINATION:    BP (!) 148/76 (BP Location: Right Arm, Patient Position: Sitting, Cuff Size: Large)   Pulse 80   Temp 98.1 F (36.7 C) (Oral)   Resp 16   Ht 5' 1.75" (1.568 m)   Wt 179 lb (81.2 kg)   LMP  (LMP Unknown)   BMI 33.01 kg/m     General appearance: alert, cooperative and appears stated age    Pelvic: External genitalia:  no lesions              Urethra:  normal appearing urethra with no masses, tenderness or lesions              Bartholins and Skenes: normal                 Vagina: normal appearing vagina with normal color  and discharge, no lesions  Cervix:  absent                Bimanual Exam:  Uterus:  absent              Adnexa: no mass, fullness, tenderness             Chaperone was present for exam.  ASSESSMENT   Recent UTI.   Status post Ciprofloxacin.  Negative urine dip today.   PLAN  Urine culture sent.  Ok to continue AZO for one more day.  Call for dysuria, back pain, nausea, or fever.  Return to Dr. Stann Mainland for routine GYN care.   An After Visit Summary was printed and given to the patient.  __15____ minutes face to face time of which over 50% was spent in counseling.

## 2017-08-11 LAB — URINALYSIS, MICROSCOPIC ONLY: CASTS: NONE SEEN /LPF

## 2017-08-12 ENCOUNTER — Telehealth: Payer: Self-pay | Admitting: Obstetrics and Gynecology

## 2017-08-12 NOTE — Telephone Encounter (Signed)
Reviewed results with Dr Quincy Simmonds.  Call to patient. Stated initial culture was  Positive for E Coli and sensitive to Cipro.  Has completed three day course of Cipro and feels fine.  Advised as directed by Dr Quincy Simmonds, if has return of symptoms, will need to return for urine culture.  Patient agreeable.   Routing to provider for review.  Will close encounter.

## 2017-08-12 NOTE — Telephone Encounter (Signed)
Patient is calling to give her lab results. Patient states 'everything is okay" with her results.

## 2017-09-19 ENCOUNTER — Ambulatory Visit: Payer: BC Managed Care – PPO | Admitting: Obstetrics and Gynecology

## 2017-09-19 ENCOUNTER — Encounter: Payer: Self-pay | Admitting: Obstetrics and Gynecology

## 2017-09-19 ENCOUNTER — Telehealth: Payer: Self-pay | Admitting: Obstetrics and Gynecology

## 2017-09-19 ENCOUNTER — Telehealth: Payer: Self-pay | Admitting: Emergency Medicine

## 2017-09-19 VITALS — BP 160/80 | HR 68 | Temp 98.6°F | Resp 14 | Ht 61.75 in | Wt 178.0 lb

## 2017-09-19 DIAGNOSIS — R319 Hematuria, unspecified: Secondary | ICD-10-CM | POA: Diagnosis not present

## 2017-09-19 DIAGNOSIS — R310 Gross hematuria: Secondary | ICD-10-CM | POA: Diagnosis not present

## 2017-09-19 LAB — POCT URINALYSIS DIPSTICK
BILIRUBIN UA: NEGATIVE
GLUCOSE UA: NEGATIVE
Ketones, UA: NEGATIVE
Nitrite, UA: NEGATIVE
ODOR: POSITIVE
PH UA: 6 (ref 5.0–8.0)
Protein, UA: POSITIVE — AB
Urobilinogen, UA: 0.2 E.U./dL

## 2017-09-19 NOTE — Telephone Encounter (Signed)
Patient called to report she's had a hysterectomy last year but is now experiencing post menopausal bleeding. She said she'd been bleeding over the weekend and the bleeding was heavy and is now lighter. She called the doctor on call and was told to call our office and schedule an appointment today. She is now scheduled with Dr. Quincy Simmonds for 1:30 PM today to arrive at 1:15 PM. Patient stated she did not need a call back from the nurse. Routing to triage for FYI only.  Last seen: 08/10/17

## 2017-09-19 NOTE — Progress Notes (Signed)
GYNECOLOGY  VISIT   HPI: 62 y.o.   Married  Caucasian  female   [id number] with No LMP recorded (lmp unknown). Patient has had a hysterectomy.   here for abnormal bleeding -- per patient started Saturday night.  States toilet was filled with blood and this lessened with each void on Saturday. Bled again in the toilet on Sunday and today.   Played pickle ball last week.   Last intercourse was Thursday.   Feels fine.  Is not hurting.  No dysuria.  No blood in her underwear.   Urine: 3+ RBC, trace WBC, trace protein, odor, cloudy - voided specimen.  GYNECOLOGIC HISTORY: No LMP recorded (lmp unknown). Patient has had a hysterectomy. Contraception:  Hysterectomy Menopausal hormone therapy:  none Last mammogram:  11/02/16 BIRADS 1 negative/density b Last pap smear:   09/01/15 Negative        OB History    Gravida  3   Para  3   Term  0   Preterm  0   AB  0   Living  3     SAB  0   TAB  0   Ectopic  0   Multiple  0   Live Births  0              Patient Active Problem List   Diagnosis Date Noted  . Hypothyroidism 02/15/2017  . Irritable bowel syndrome 02/15/2017  . Obesity with body mass index 30 or greater 02/15/2017  . 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 Recurrent UTI 10/19/2016    Past Medical History:  Diagnosis Date  . History of kidney stones    2012  . Hyperlipidemia 2018   TG - 180, LDL - 132  . Hypothyroidism   . Thyroid disease   . Urinary incontinence     Past Surgical History:  Procedure Laterality Date  . ANTERIOR AND POSTERIOR REPAIR N/A 11/22/2016   Procedure: POSTERIOR REPAIR (RECTOCELE);  Surgeon: Nunzio Cobbs, MD;  Location: [facility];  Service: Gynecology;  Laterality: N/A;  . BLADDER SUSPENSION N/A 11/22/2016   Procedure: TRANSVAGINAL TAPE (TVT) PROCEDURE exact mid urethral sling;  Surgeon: Nunzio Cobbs, MD;  Location: [facility];  Service: Gynecology;   Laterality: N/A;  . BUNIONECTOMY    . CYSTOSCOPY N/A 11/22/2016   Procedure: CYSTOSCOPY;  Surgeon: Nunzio Cobbs, MD;  Location: [facility];  Service: Gynecology;  Laterality: N/A;  . TONSILLECTOMY      Current Outpatient Medications  Medication Sig Dispense Refill  . acetaminophen (TYLENOL) 500 MG tablet Take 500 mg by mouth as needed.    Marland Kitchen CALCIUM PO Take 1 tablet by mouth daily.     Marland Kitchen dicyclomine (BENTYL) 20 MG tablet Take 20 mg by mouth daily as needed for spasms.     . Estrogens, Conjugated (PREMARIN VA) Place vaginally. Place 0.5 g vaginally    . levothyroxine (SYNTHROID) 75 MCG tablet Take 75 mcg by mouth daily.     . Multiple Vitamin (MULTIVITAMIN) tablet Take 1 tablet by mouth daily.    [name] Glycol-Propyl Glycol (SYSTANE OP) Place 1 drop into both eyes every morning.     No current facility-administered medications for this visit.      ALLERGIES: Nsaids  Family History  Problem Relation Age of Onset  . Multiple sclerosis Mother   . Liver cancer Maternal Grandmother   . Heart attack Maternal Grandfather   .  Hypertension Maternal Grandfather     Social History   Socioeconomic History  . Marital status: Married    Spouse name: Not on file  . Number of children: Not on file  . Years of education: Not on file  . Highest education level: Not on file  Occupational History  . Not on file  Social Needs  . Financial resource strain: Not on file  . Food insecurity:    Worry: Not on file    Inability: Not on file  . Transportation needs:    Medical: Not on file    Non-medical: Not on file  Tobacco Use  . Smoking status: Never Smoker  . Smokeless tobacco: Never Used  Substance and Sexual Activity  . Alcohol use: Yes    Alcohol/week: 1.0 standard drinks    Types: 1 Glasses of wine per week    Comment: occasional  . Drug use: No  . Sexual activity: Yes    Birth control/protection: Post-menopausal, Surgical    Comment: Hysterectomy  Lifestyle  .  Physical activity:    Days per week: Not on file    Minutes per session: Not on file  . Stress: Not on file  Relationships  . Social connections:    Talks on phone: Not on file    Gets together: Not on file    Attends religious service: Not on file    Active member of club or organization: Not on file    Attends meetings of clubs or organizations: Not on file    Relationship status: Not on file  . Intimate partner violence:    Fear of current or ex partner: Not on file    Emotionally abused: Not on file    Physically abused: Not on file    Forced sexual activity: Not on file  Other Topics Concern  . Not on file  Social History Narrative  . Not on file    Review of Systems  Genitourinary:       Blood in urine  All other systems reviewed and are negative.   PHYSICAL EXAMINATION:    BP (!) 160/80 (BP Location: Right Arm, Patient Position: Sitting, Cuff Size: Large)   Pulse 68   Temp 98.6 F (37 C) (Oral)   Resp 14   Ht 5' 1.75" (1.568 m)   Wt 178 lb (80.7 kg)   LMP  (LMP Unknown)   BMI 32.82 kg/m     General appearance: alert, cooperative and appears stated age   Pelvic: External genitalia:  no lesions              Urethra:  normal appearing urethra with no masses, tenderness or lesions              Bartholins and Skenes: normal                 Vagina: normal appearing vagina with normal color and discharge, no lesions.  Well supported.              Cervix: absent                Bimanual Exam:  Uterus:   Absent.              Adnexa: no mass, fullness, tenderness              Rectal exam: Yes.   Confirms.              Anus:  normal sphincter tone, no lesions  Sterile cath performed with betadine prep after verbal consent. 100 cc obtained of clear yellow urine and then a small clot in the tubing at the end. Sent for micro and culture.   Chaperone was present for exam.  ASSESSMENT  Gross hematuria.  Hx midurethral sling.  Hx renal stone.  PLAN  Discussed  possible etiologies for hematuria - infection, mesh erosion/exposure, renal/bladder pathology, stones.  Urine micro and culture.  Referral to [facility].    An After Visit Summary was printed and given to the patient.  _15_____ minutes face to face time of which over 50% was spent in counseling.

## 2017-09-19 NOTE — Telephone Encounter (Signed)
Call to patient and appointment given for [facility] in [place] 09/26/17 at 1:15. Information given.   Hold off on cancelling appointment right now at [facility] as there may be a cancellation.   She is advised to go directly to [facility] emergency room if develops active bleeding again and not wait for appointment.   Pt verbalized understanding and will call back with any questions.

## 2017-09-20 LAB — URINALYSIS, MICROSCOPIC ONLY
CASTS: NONE SEEN /LPF
EPITHELIAL CELLS (NON RENAL): NONE SEEN /HPF (ref 0–10)

## 2017-09-21 LAB — URINE CULTURE: Organism ID, Bacteria: NO GROWTH

## 2017-09-21 NOTE — Telephone Encounter (Signed)
Encounter closed

## 2017-09-21 NOTE — Telephone Encounter (Signed)
Spoke with patient. Patient states she still continues to have blood in urine. Amounts are smaller and is intermittent. Denies pain or any other GYN symptoms, asking if Urology appt can be moved to earlier date. Advised I will call [facility] and [facility] and return call. ER precautions reviewed. Patient verbalizes understanding.

## 2017-09-21 NOTE — Telephone Encounter (Signed)
Thank you for making this sooner appointment.

## 2017-09-21 NOTE — Telephone Encounter (Signed)
Call placed to [facility], spoke with nurse Mickel Baas. No appt available for today, will contact patient directly to schedule if earlier appt becomes available.   Call placed to [facility] [phone], spoke with Katina Dung. Patient scheduled for OV on 8/22 at 8:40am with Erik Obey, NP.   Call returned to patient, advised of appt at [facility]. ER precautions reviewed. Patient verbalizes understanding and is agreeable.   Routing to Dr. Quincy Simmonds

## 2017-12-05 DIAGNOSIS — C642 Malignant neoplasm of left kidney, except renal pelvis: Secondary | ICD-10-CM | POA: Insufficient documentation

## 2018-09-13 ENCOUNTER — Other Ambulatory Visit: Payer: Self-pay | Admitting: Family Medicine

## 2018-09-13 DIAGNOSIS — E2839 Other primary ovarian failure: Secondary | ICD-10-CM

## 2018-11-16 ENCOUNTER — Other Ambulatory Visit: Payer: Self-pay

## 2018-11-16 ENCOUNTER — Ambulatory Visit
Admission: RE | Admit: 2018-11-16 | Discharge: 2018-11-16 | Disposition: A | Discharge status: Home / Self Care | Payer: Medicare Other | Source: Ambulatory Visit | Attending: Family Medicine | Admitting: Family Medicine

## 2018-11-16 DIAGNOSIS — E2839 Other primary ovarian failure: Secondary | ICD-10-CM

## 2018-11-21 ENCOUNTER — Other Ambulatory Visit: Payer: BC Managed Care – PPO

## 2019-02-04 ENCOUNTER — Other Ambulatory Visit: Payer: Self-pay

## 2019-02-04 ENCOUNTER — Emergency Department (HOSPITAL_COMMUNITY)
Admission: EM | Admit: 2019-02-04 | Discharge: 2019-02-04 | Disposition: A | Discharge status: Home / Self Care | Payer: Medicare PPO | Attending: Emergency Medicine | Admitting: Emergency Medicine

## 2019-02-04 ENCOUNTER — Emergency Department (HOSPITAL_COMMUNITY): Payer: Medicare PPO

## 2019-02-04 DIAGNOSIS — R0602 Shortness of breath: Secondary | ICD-10-CM

## 2019-02-04 DIAGNOSIS — E039 Hypothyroidism, unspecified: Secondary | ICD-10-CM | POA: Insufficient documentation

## 2019-02-04 DIAGNOSIS — U071 COVID-19: Secondary | ICD-10-CM | POA: Diagnosis not present

## 2019-02-04 DIAGNOSIS — Z79899 Other long term (current) drug therapy: Secondary | ICD-10-CM | POA: Insufficient documentation

## 2019-02-04 LAB — URINALYSIS, ROUTINE W REFLEX MICROSCOPIC
Bilirubin Urine: NEGATIVE
Glucose, UA: NEGATIVE mg/dL
Hgb urine dipstick: NEGATIVE
Ketones, ur: NEGATIVE mg/dL
Leukocytes,Ua: NEGATIVE
Nitrite: NEGATIVE
Protein, ur: NEGATIVE mg/dL
Specific Gravity, Urine: 1.003 — ABNORMAL LOW (ref 1.005–1.030)
pH: 7 (ref 5.0–8.0)

## 2019-02-04 LAB — CBC WITH DIFFERENTIAL/PLATELET
Abs Immature Granulocytes: 0.01 10*3/uL (ref 0.00–0.07)
Basophils Absolute: 0 10*3/uL (ref 0.0–0.1)
Basophils Relative: 0 %
Eosinophils Absolute: 0 10*3/uL (ref 0.0–0.5)
Eosinophils Relative: 0 %
HCT: 40.9 % (ref 36.0–46.0)
Hemoglobin: 13.7 g/dL (ref 12.0–15.0)
Immature Granulocytes: 0 %
Lymphocytes Relative: 25 %
Lymphs Abs: 1.4 10*3/uL (ref 0.7–4.0)
MCH: 30.1 pg (ref 26.0–34.0)
MCHC: 33.5 g/dL (ref 30.0–36.0)
MCV: 89.9 fL (ref 80.0–100.0)
Monocytes Absolute: 0.7 10*3/uL (ref 0.1–1.0)
Monocytes Relative: 12 %
Neutro Abs: 3.3 10*3/uL (ref 1.7–7.7)
Neutrophils Relative %: 63 %
Platelets: 221 10*3/uL (ref 150–400)
RBC: 4.55 MIL/uL (ref 3.87–5.11)
RDW: 11.8 % (ref 11.5–15.5)
WBC: 5.3 10*3/uL (ref 4.0–10.5)
nRBC: 0 % (ref 0.0–0.2)

## 2019-02-04 LAB — HIV ANTIBODY (ROUTINE TESTING W REFLEX): HIV Screen 4th Generation wRfx: NONREACTIVE

## 2019-02-04 LAB — COMPREHENSIVE METABOLIC PANEL
ALT: 15 U/L (ref 0–44)
AST: 21 U/L (ref 15–41)
Albumin: 3.8 g/dL (ref 3.5–5.0)
Alkaline Phosphatase: 71 U/L (ref 38–126)
Anion gap: 10 (ref 5–15)
BUN: 16 mg/dL (ref 8–23)
CO2: 27 mmol/L (ref 22–32)
Calcium: 9.3 mg/dL (ref 8.9–10.3)
Chloride: 103 mmol/L (ref 98–111)
Creatinine, Ser: 0.89 mg/dL (ref 0.44–1.00)
GFR calc Af Amer: >60 mL/min (ref >60)
GFR calc non Af Amer: >60 mL/min (ref >60)
Glucose, Bld: 104 mg/dL — ABNORMAL HIGH (ref 70–99)
Potassium: 4.2 mmol/L (ref 3.5–5.1)
Sodium: 140 mmol/L (ref 135–145)
Total Bilirubin: 0.6 mg/dL (ref 0.3–1.2)
Total Protein: 7.1 g/dL (ref 6.5–8.1)

## 2019-02-04 LAB — C-REACTIVE PROTEIN: CRP: 3.7 mg/dL — ABNORMAL HIGH (ref <1.0)

## 2019-02-04 LAB — LACTATE DEHYDROGENASE: LDH: 173 U/L (ref 98–192)

## 2019-02-04 LAB — D-DIMER, QUANTITATIVE: D-Dimer, Quant: 0.82 ug/mL-FEU — ABNORMAL HIGH (ref 0.00–0.50)

## 2019-02-04 LAB — TROPONIN I (HIGH SENSITIVITY): Troponin I (High Sensitivity): 2 ng/L (ref <18)

## 2019-02-04 LAB — POC SARS CORONAVIRUS 2 AG -  ED: SARS Coronavirus 2 Ag: POSITIVE — AB

## 2019-02-04 LAB — PROCALCITONIN: Procalcitonin: <0.1 ng/mL

## 2019-02-04 LAB — BRAIN NATRIURETIC PEPTIDE: B Natriuretic Peptide: 27.3 pg/mL (ref 0.0–100.0)

## 2019-02-04 LAB — FERRITIN: Ferritin: 187 ng/mL (ref 11–307)

## 2019-02-04 LAB — FIBRINOGEN: Fibrinogen: 501 mg/dL — ABNORMAL HIGH (ref 210–475)

## 2019-02-04 NOTE — ED Notes (Signed)
Ambulated pt in room while monitoring oxygen levels.  Oxygen maintained at or above 93% throughout. PA notified.

## 2019-02-04 NOTE — ED Notes (Signed)
Provided pt with crackers and peanut butter, along with a drink.

## 2019-02-04 NOTE — Discharge Instructions (Addendum)
Your laboratory results were within normal limits.  We discussed the follow-up that you will be provided via telephone, I have arranged this for you.  You may expect a call from Glory Buff NP.   You may also purchase some Mucinex DM over-the-counter, this will help with breaking down mucus.   If you experience any chest pain, worsening symptoms or oxygen drops below 90% please return to the ED.

## 2019-02-04 NOTE — ED Provider Notes (Signed)
[facility] DEPT Provider Note   CSN: [id number] Arrival date & time: 02/04/19  1141     History Chief Complaint  Patient presents with  . Shortness of Breath    Kellie Case is a 64 y.o. female.  64 y.o female with a PMH of Renal CA, thyroid disease presents to the ED with a chief complaint of shortness of breath x 11 days. Patient was diagnosed with Covid 19 infection on 01/25/2019 at her PCP office, she has been treating her symptoms at home with OTC medication such as tylenol, zinc, vitamin C, echinacea but reports her symptoms have worsen. She endorses a fever with a Tmax of 101. She also reports a headache this is generalized throughout her whole head beginning in the back and radiating to the front of her head.  Patient reports purchasing a pulse ox, reports measuring her stats last night when she noted her oxygen waxing and waning from low 90s to mid 90s.  She reports she feels her symptoms have worsened, stating that they get worse throughout the night.  She is concerned as she does have a previous history of renal CA.  She also endorses some sense of urgency, reports she feels like she has not fully voided after urination.  She denies any chest pain, prior history of blood clots, calf tenderness or weakness.  Of note, patient is currently taking intravaginal estrogen.  The history is provided by the patient and medical records.       Past Medical History:  Diagnosis Date  . History of kidney stones    2012  . Hyperlipidemia 2018   TG - 180, LDL - 132  . Hypothyroidism   . Thyroid disease   . Urinary incontinence     Patient Active Problem List   Diagnosis Date Noted  . Hypothyroidism 02/15/2017  . Irritable bowel syndrome 02/15/2017  . Obesity with body mass index 30 or greater 02/15/2017  . 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 Recurrent UTI 10/19/2016    Past Surgical History:    Procedure Laterality Date  . ANTERIOR AND POSTERIOR REPAIR N/A 11/22/2016   Procedure: POSTERIOR REPAIR (RECTOCELE);  Surgeon: Nunzio Cobbs, MD;  Location: [facility];  Service: Gynecology;  Laterality: N/A;  . BLADDER SUSPENSION N/A 11/22/2016   Procedure: TRANSVAGINAL TAPE (TVT) PROCEDURE exact mid urethral sling;  Surgeon: Nunzio Cobbs, MD;  Location: [facility];  Service: Gynecology;  Laterality: N/A;  . BUNIONECTOMY    . CYSTOSCOPY N/A 11/22/2016   Procedure: CYSTOSCOPY;  Surgeon: Nunzio Cobbs, MD;  Location: [facility];  Service: Gynecology;  Laterality: N/A;  . TONSILLECTOMY       OB History    Gravida  3   Para  3   Term  0   Preterm  0   AB  0   Living  3     SAB  0   TAB  0   Ectopic  0   Multiple  0   Live Births  0           Family History  Problem Relation Age of Onset  . Multiple sclerosis Mother   . Liver cancer Maternal Grandmother   . Heart attack Maternal Grandfather   . Hypertension Maternal Grandfather     Social History   Tobacco Use  . Smoking status: Never Smoker  . Smokeless tobacco: Never Used  Substance Use  Topics  . Alcohol use: Yes    Alcohol/week: 1.0 standard drinks    Types: 1 Glasses of wine per week    Comment: occasional  . Drug use: No    Home Medications Prior to Admission medications   Medication Sig Start Date End Date Taking? Authorizing Provider  acetaminophen (TYLENOL) 500 MG tablet Take 500 mg by mouth as needed.   Yes [provider]  CALCIUM PO Take 1 tablet by mouth daily.    Yes [provider]  dicyclomine (BENTYL) 20 MG tablet Take 20 mg by mouth daily as needed for spasms.    Yes [provider]  Estradiol 10 MCG TABS vaginal tablet Take 1 tablet by mouth 2 (two) times a week. 11/11/18  Yes [provider]  levothyroxine (SYNTHROID) 75 MCG tablet Take 75 mcg by mouth daily.    Yes [provider]  Multiple Vitamin  (MULTIVITAMIN) tablet Take 1 tablet by mouth daily.   Yes [provider]  Polyethyl Glycol-Propyl Glycol (SYSTANE OP) Place 1 drop into both eyes every morning.   Yes [provider]  triamterene-hydrochlorothiazide (MAXZIDE-25) 37.5-25 MG tablet Take 1 tablet by mouth every morning. As needed for fluid retention   Yes [provider]  Estrogens, Conjugated (PREMARIN VA) Place vaginally. Place 0.5 g vaginally    [provider]    Allergies    Nsaids  Review of Systems   Review of Systems  Constitutional: Positive for fever.  HENT: Negative for sore throat.   Respiratory: Positive for shortness of breath. Negative for cough and chest tightness.   Cardiovascular: Negative for chest pain.  Gastrointestinal: Positive for diarrhea. Negative for abdominal pain, nausea and vomiting.  Genitourinary: Negative for flank pain.  Musculoskeletal: Negative for back pain.  Neurological: Positive for headaches. Negative for tremors, weakness and light-headedness.    Physical Exam Updated Vital Signs BP (!) 152/83   Pulse 74   Temp 98.7 F (37.1 C) (Oral)   Resp 20   LMP  (LMP Unknown)   SpO2 91%   Physical Exam Vitals and nursing note reviewed.  Constitutional:      Appearance: She is well-developed.  HENT:     Head: Normocephalic and atraumatic.  Eyes:     Pupils: Pupils are equal, round, and reactive to light.  Cardiovascular:     Rate and Rhythm: Normal rate.     Comments: No BL pitting edema. No calf tenderness BL.  Pulmonary:     Effort: No tachypnea, accessory muscle usage, respiratory distress or retractions.     Breath sounds: Normal air entry. No stridor. Examination of the right-lower field reveals decreased breath sounds. Examination of the left-lower field reveals decreased breath sounds. Decreased breath sounds present. No wheezing, rhonchi or rales.     Comments: Lower lung fields are diminished BL.  Chest:     Chest wall: No  tenderness.  Abdominal:     Palpations: Abdomen is soft. There is no mass.     Tenderness: There is no abdominal tenderness.  Musculoskeletal:     Right lower leg: No tenderness. No edema.     Left lower leg: No tenderness. No edema.  Skin:    General: Skin is warm and dry.  Neurological:     Mental Status: She is alert and oriented to person, place, and time.     ED Results / Procedures / Treatments   Labs (all labs ordered are listed, but only abnormal results are displayed) Labs  Reviewed  COMPREHENSIVE METABOLIC PANEL - Abnormal; Notable for the following components:      Result Value   Glucose, Bld 104 (*)    All other components within normal limits  URINALYSIS, ROUTINE W REFLEX MICROSCOPIC - Abnormal; Notable for the following components:   Color, Urine STRAW (*)    Specific Gravity, Urine 1.003 (*)    All other components within normal limits  C-REACTIVE PROTEIN - Abnormal; Notable for the following components:   CRP 3.7 (*)    All other components within normal limits  D-DIMER, QUANTITATIVE (NOT AT [facility]) - Abnormal; Notable for the following components:   D-Dimer, Quant 0.82 (*)    All other components within normal limits  FIBRINOGEN - Abnormal; Notable for the following components:   Fibrinogen 501 (*)    All other components within normal limits  POC SARS CORONAVIRUS 2 AG -  ED - Abnormal; Notable for the following components:   SARS Coronavirus 2 Ag POSITIVE (*)    All other components within normal limits  URINE CULTURE  CBC WITH DIFFERENTIAL/PLATELET  BRAIN NATRIURETIC PEPTIDE  PROCALCITONIN  LACTATE DEHYDROGENASE  FERRITIN  HIV ANTIBODY (ROUTINE TESTING W REFLEX)  TROPONIN I (HIGH SENSITIVITY)  TROPONIN I (HIGH SENSITIVITY)    EKG EKG Interpretation  Date/Time:  Sunday February 04 2019 13:13:56 EST Ventricular Rate:  75 PR Interval:    QRS Duration: 87 QT Interval:  400 QTC Calculation: 447 R Axis:   115 Text Interpretation: Sinus rhythm Right  axis deviation since last tracing no significant change Confirmed by Daleen Bo [phone]) on 02/04/2019 2:06:25 PM   Radiology DG Chest Portable 1 View  Result Date: 02/04/2019 CLINICAL DATA:  Shortness of breath. EXAM: PORTABLE CHEST 1 VIEW COMPARISON:  None. FINDINGS: The heart size and mediastinal contours are within normal limits. No pneumothorax or pleural effusion is noted. Minimal bibasilar subsegmental atelectasis or infiltrates are noted. The visualized skeletal structures are unremarkable. IMPRESSION: Minimal bibasilar subsegmental atelectasis or infiltrates are noted. Followup radiographs are recommended until resolution. Electronically Signed   By: Marijo Conception M.D.   On: 02/04/2019 13:17    Procedures Procedures (including critical care time)  Medications Ordered in ED Medications - No data to display  ED Course  I have reviewed the triage vital signs and the nursing notes.  Pertinent labs & imaging results that were available during my care of the patient were reviewed by me and considered in my medical decision making (see chart for details).  Clinical Course as of Feb 04 1620  Nancy Fetter Feb 04, 2019  1323 SARS Coronavirus 2 Ag(!): POSITIVE [JS]    Clinical Course User Index [JS] Janeece Fitting, PA-C   MDM Rules/Calculators/A&P    Patient with a past medical history of renal cell carcinoma,thryid disease presents to the ED with a chief complaint of shortness of breath x10 days.  Patient was originally diagnosed with COVID-19 at her PCPs office on 01/25/2019, she reports has been monitoring her symptoms at home however she reports these have now worsened.  She is states last night she desatted to the low 90s, has been measuring her pulse at home.  She does port a fever for the last couple days with a T-max of 101, she has been taking Tylenol for this.  Unable to take NSAIDs due to previous renal CA.  She arrived with a BP slightly elevated, afebrile, no tachycardia, oxygen is  around 93% on room air.  There is no tachypnea present, she does  speak in full sentences, air movement is within normal limits.  Lungs are slightly diminished on the bilateral lower fields.  There is no calf tenderness, pitting edema.  No prior history of CHF, no prior history of any blood clots.  CBC without any leukocytosis, hemoglobin stable without signs of anemia.  CMP without any electrolyte derangement.  No acute kidney injury, LFTs are unremarkable.  Troponin has been negative.  BNP is within normal limits.  Her vision is elevated, C-reactive protein is also elevated, likely due to COVID-19 infection.Dimer is slightly elevated, suspect this is likely to COVID-19 infection.  There is no tachycardia, hypoxia is present on exam, no smoking history and only uses intravaginal estrogen.  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  She is agreeable of not obtaining CT angio PE.  Patient's case was discussed with you ongoing COVID-19 symptom check program, she will be followed by Glory Buff, NP as I have arranged this via telephone call.  X-rays with minimal atelectasis noted.  I do not feel that she needs antibiotic treatment at this time as patient is on day 04-26-22 of her deceased course.  She was ambulated by nursing staff with oxygen at 93% on room air without any tachycardia. Patient is agreeable with discharge at this time. Return precautions discussed at length.    Portions of this note were generated with [employer]. Dictation errors may occur despite best attempts at proofreading.  Final Clinical Impression(s) / ED Diagnoses Final diagnoses:  Shortness of breath  COVID-19 virus detected    Rx / DC Orders ED Discharge Orders    None       Janeece Fitting, PA-C 02/04/19 1622    Pattricia Boss, MD 02/05/19  1535

## 2019-02-04 NOTE — ED Triage Notes (Signed)
Patient here with complaints of decreased O2 sats last night around 91%. Covid + result 11 days ago.  Complains of headache now.  Also, has one kidney and is complaining of urgency and frequency.

## 2019-02-05 LAB — URINE CULTURE: Culture: >=100000 — AB

## 2019-02-06 NOTE — Progress Notes (Signed)
ED Antimicrobial Stewardship Positive Culture Follow Up   Kellie Case is an 64 y.o. female who presented to [facility] on 02/04/2019 with a chief complaint of shortness of breath x 11 days. She endorses fever up to 101 at home. Diagnosed with COVID-19 on 01/25/2019 at PCP's office. Patient also endorses some sense of urgency, reports she feels like she has not fully voided after urination. No leukocytosis. Afebrile in ED.    Recent Results (from the past 720 hour(s))  Urine culture     Status: Abnormal   Collection Time: 02/04/19 12:43 PM   Specimen: Urine, Catheterized  Result Value Ref Range Status   Specimen Description   Final    URINE, CATHETERIZED Performed at [facility] [address] [place], [place] [address]    Special Requests   Final    NONE Performed at [facility], [place] [address], [place], [place] [address]    Culture (A)  Final    >=100,000 COLONIES/mL GROUP B STREP(S.AGALACTIAE)ISOLATED TESTING AGAINST S. AGALACTIAE NOT ROUTINELY PERFORMED DUE TO PREDICTABILITY OF AMP/PEN/VAN SUSCEPTIBILITY. Performed at [facility], [place] [address], [place], [place] [address]    Report Status 02/05/2019 FINAL  Final    PLAN: Call patient to see if she is still experiencing urinary symptoms. If still having urinary symptoms, prescribe Amoxicillin 500mg  PO BID x 5 days.   ED Provider: Dr. Orville Govern M 02/06/2019, 4:38 PM Clinical Pharmacist [phone]

## 2019-02-07 NOTE — Progress Notes (Signed)
Covid home visit on behalf of [facility].  Ms. Segrist was worried about her BP having been higher than it normally runs.  She had just gotten a new BP medication from pharmacy and took the first dose.  Otherwise she said she felt better than she had been feeling.  Husband was home as well.  Visit details were transcribed by Glory Buff, DNP into [facility] and will f/u w/her tomorrow.

## 2019-02-09 NOTE — Progress Notes (Signed)
Late entry for 02/08/19: Covid home visit on behalf of [facility].  Ms. Dosen stated she feels much better than she had been feeling.  She was in no acute distress.  Did have a dry cough and c/o fatigue when doing too much.  Stated she was going on walks w/husband and drinking a lot of fluids.  She did c/o waking up sweaty for the last several nights and very anxious about her BP readings.  She was given reassurance and new Rx for lisinopril by Sharyn Lull, DNP w/[facility].  Will continue to monitor closely by phone and in person prn.  She also has access to on-call nurses w/[facility] 24/7.  Complete vitals transcribed into Dr[facility]rono by Sharyn Lull, DNP w/[facility].

## 2019-03-15 ENCOUNTER — Ambulatory Visit: Payer: Medicare Other

## 2019-03-17 ENCOUNTER — Ambulatory Visit: Payer: Medicare Other

## 2019-06-01 DIAGNOSIS — M79644 Pain in right finger(s): Secondary | ICD-10-CM | POA: Diagnosis not present

## 2019-06-01 DIAGNOSIS — I10 Essential (primary) hypertension: Secondary | ICD-10-CM | POA: Diagnosis not present

## 2019-06-04 DIAGNOSIS — Z85528 Personal history of other malignant neoplasm of kidney: Secondary | ICD-10-CM | POA: Diagnosis not present

## 2019-06-04 DIAGNOSIS — R911 Solitary pulmonary nodule: Secondary | ICD-10-CM | POA: Diagnosis not present

## 2019-06-04 DIAGNOSIS — Z905 Acquired absence of kidney: Secondary | ICD-10-CM | POA: Diagnosis not present

## 2019-06-04 DIAGNOSIS — C642 Malignant neoplasm of left kidney, except renal pelvis: Secondary | ICD-10-CM | POA: Diagnosis not present

## 2019-06-04 DIAGNOSIS — Z08 Encounter for follow-up examination after completed treatment for malignant neoplasm: Secondary | ICD-10-CM | POA: Diagnosis not present

## 2019-06-21 DIAGNOSIS — L918 Other hypertrophic disorders of the skin: Secondary | ICD-10-CM | POA: Diagnosis not present

## 2019-06-21 DIAGNOSIS — D2272 Melanocytic nevi of left lower limb, including hip: Secondary | ICD-10-CM | POA: Diagnosis not present

## 2019-06-21 DIAGNOSIS — D692 Other nonthrombocytopenic purpura: Secondary | ICD-10-CM | POA: Diagnosis not present

## 2019-06-21 DIAGNOSIS — Z419 Encounter for procedure for purposes other than remedying health state, unspecified: Secondary | ICD-10-CM | POA: Diagnosis not present

## 2019-06-21 DIAGNOSIS — L821 Other seborrheic keratosis: Secondary | ICD-10-CM | POA: Diagnosis not present

## 2019-07-18 DIAGNOSIS — H35363 Drusen (degenerative) of macula, bilateral: Secondary | ICD-10-CM | POA: Diagnosis not present

## 2019-07-18 DIAGNOSIS — H2513 Age-related nuclear cataract, bilateral: Secondary | ICD-10-CM | POA: Diagnosis not present

## 2019-07-18 DIAGNOSIS — H524 Presbyopia: Secondary | ICD-10-CM | POA: Diagnosis not present

## 2019-07-18 DIAGNOSIS — H40033 Anatomical narrow angle, bilateral: Secondary | ICD-10-CM | POA: Diagnosis not present

## 2019-07-18 DIAGNOSIS — H25013 Cortical age-related cataract, bilateral: Secondary | ICD-10-CM | POA: Diagnosis not present

## 2019-09-17 DIAGNOSIS — E7800 Pure hypercholesterolemia, unspecified: Secondary | ICD-10-CM | POA: Diagnosis not present

## 2019-09-17 DIAGNOSIS — I10 Essential (primary) hypertension: Secondary | ICD-10-CM | POA: Diagnosis not present

## 2019-09-17 DIAGNOSIS — Z85528 Personal history of other malignant neoplasm of kidney: Secondary | ICD-10-CM | POA: Diagnosis not present

## 2019-09-17 DIAGNOSIS — I708 Atherosclerosis of other arteries: Secondary | ICD-10-CM | POA: Diagnosis not present

## 2019-09-17 DIAGNOSIS — Z0000 Encounter for general adult medical examination without abnormal findings: Secondary | ICD-10-CM | POA: Diagnosis not present

## 2019-10-12 ENCOUNTER — Other Ambulatory Visit: Payer: Self-pay

## 2019-10-12 ENCOUNTER — Other Ambulatory Visit: Payer: Medicare Other

## 2019-10-12 DIAGNOSIS — Z20822 Contact with and (suspected) exposure to covid-19: Secondary | ICD-10-CM | POA: Diagnosis not present

## 2019-10-15 LAB — NOVEL CORONAVIRUS, NAA: SARS-CoV-2, NAA: NOT DETECTED

## 2019-10-29 DIAGNOSIS — M545 Low back pain: Secondary | ICD-10-CM | POA: Diagnosis not present

## 2019-10-31 ENCOUNTER — Other Ambulatory Visit: Payer: Medicare Other

## 2019-10-31 DIAGNOSIS — Z20822 Contact with and (suspected) exposure to covid-19: Secondary | ICD-10-CM | POA: Diagnosis not present

## 2019-10-31 DIAGNOSIS — I10 Essential (primary) hypertension: Secondary | ICD-10-CM | POA: Diagnosis not present

## 2019-11-01 LAB — SARS-COV-2, NAA 2 DAY TAT

## 2019-11-01 LAB — NOVEL CORONAVIRUS, NAA: SARS-CoV-2, NAA: NOT DETECTED

## 2019-11-05 ENCOUNTER — Telehealth: Payer: Self-pay

## 2019-11-05 NOTE — Telephone Encounter (Signed)
Patient called wanting an earlier visit then what she is scheduled 11/15/2019 front desk is aware and is going to get patient scheduled sooner due to patient not feeling well and wanting to be seen due to her heart rate

## 2019-11-12 DIAGNOSIS — I10 Essential (primary) hypertension: Secondary | ICD-10-CM | POA: Insufficient documentation

## 2019-11-12 DIAGNOSIS — I493 Ventricular premature depolarization: Secondary | ICD-10-CM | POA: Insufficient documentation

## 2019-11-12 NOTE — Progress Notes (Signed)
Patient referred by Alroy Dust, L.Marlou Sa, MD for [facility]  Subjective:   Kellie Case, female    DOB: Jun 10, 1955, 64 y.o.   MRN: [id number]   Chief Complaint  Patient presents with  . Trigeminy  . New Patient (Initial Visit)    Referred by Allison Quarry, MD     HPI  64 y.o. Caucasian female with hypertension,hypothyroidism, acquired solitary kidney s/p nephrectomy, frequent PVC's  Patient is retired, [facility]  She does not have any exercise induced chest pain, shortness of breath, palpitation symptoms.  Recently, she was incidentally found to have frequent PVCs on EKG.  Patient noted that her heart rate would read in 40s at times on monitor.  She has noticed that her blood pressure has continued to increase since she had Covid in January 2021.  She is currently on lisinopril 20 mg daily.  She wonders if her PVCs are related to lisinopril.  Past Medical History:  Diagnosis Date  . History of kidney stones    2012  . Hyperlipidemia 2018   TG - 180, LDL - 132  . Hypothyroidism   . Thyroid disease   . Urinary incontinence      Past Surgical History:  Procedure Laterality Date  . ANTERIOR AND POSTERIOR REPAIR N/A 11/22/2016   Procedure: POSTERIOR REPAIR (RECTOCELE);  Surgeon: Nunzio Cobbs, MD;  Location: [place];  Service: Gynecology;  Laterality: N/A;  . BLADDER SUSPENSION N/A 11/22/2016   Procedure: TRANSVAGINAL TAPE (TVT) PROCEDURE exact mid urethral sling;  Surgeon: Nunzio Cobbs, MD;  Location: [place] [facility];  Service: Gynecology;  Laterality: N/A;  . BUNIONECTOMY    . CYSTOSCOPY N/A 11/22/2016   Procedure: CYSTOSCOPY;  Surgeon: Nunzio Cobbs, MD;  Location: [place] [facility];  Service: Gynecology;  Laterality: N/A;  . TONSILLECTOMY       Social History   Tobacco Use  Smoking Status Never Smoker  Smokeless Tobacco Never Used    Social History   Substance and Sexual Activity  Alcohol Use Yes  . Alcohol/week:  1.0 standard drink  . Types: 1 Glasses of wine per week   Comment: occasional     Family History  Problem Relation Age of Onset  . Multiple sclerosis Mother   . Liver cancer Maternal Grandmother   . Heart attack Maternal Grandfather   . Hypertension Maternal Grandfather      Current Outpatient Medications on File Prior to Visit  Medication Sig Dispense Refill  . CALCIUM PO Take 1 tablet by mouth daily.     . Estradiol 10 MCG TABS vaginal tablet Take 1 tablet by mouth 2 (two) times a week.    . levothyroxine (SYNTHROID) 75 MCG tablet Take 75 mcg by mouth daily.     Marland Kitchen lisinopril (ZESTRIL) 20 MG tablet Take 20 mg by mouth daily.    . Multiple Vitamin (MULTIVITAMIN) tablet Take 1 tablet by mouth daily.    [name] Glycol-Propyl Glycol (SYSTANE OP) Place 1 drop into both eyes every morning.     No current facility-administered medications on file prior to visit.    Cardiovascular and other pertinent studies:  EKG 11/14/2019: Sinus rhythm 73 bpm Normal EKG  EKG 10/31/2019: Sinus rhythm 66 bpm  Frequent PVCs   Recent labs: 02/04/2019: Glucose 104, BUN/Cr 16/0.89. EGFR >60. Na/K 140/4.2. Rest of the CMP normal H/H 13/40. MCV 89. Platelets 221 TSH N/A BNP 27, trop HS 2 CRP 3.7  Review of Systems  Cardiovascular: Negative for chest pain, dyspnea on exertion, leg swelling, palpitations and syncope.  Psychiatric/Behavioral: The patient is nervous/anxious.          Vitals:   11/14/19 0846  BP: (!) 173/84  Pulse: 73  Resp: 16  SpO2: 94%     Body mass index is 31.65 kg/m. Filed Weights   11/14/19 0846  Weight: 167 lb 8 oz (76 kg)     Objective:   Physical Exam Vitals and nursing note reviewed.  Constitutional:      General: She is not in acute distress. Neck:     Vascular: No JVD.  Cardiovascular:     Rate and Rhythm: Normal rate and regular rhythm.     Heart sounds: Normal heart sounds. No murmur heard.   Pulmonary:     Effort: Pulmonary effort  is normal.     Breath sounds: Normal breath sounds. No wheezing or rales.            Assessment & Recommendations:   64 y.o. Caucasian female with hypertension,hypothyroidism, acquired solitary kidney s/p nephrectomy, frequent PVC's  Frequent PVCs: Incidental finding.  No specific symptoms correlating PVCs seen on EKG at her EKG office.  However, I suspect her proceed bradycardia may be related to frequent PVCs.  EKG today is normal.  I am not convinced that this is related to lisinopril.  Recommend echocardiogram, 1 week cardiac monitor, and exercise treadmill stress test.  I recommend continuing blood pressure checks at home.  F/u after above testing.    Thank you for referring the patient to [place]. Please feel free to contact with any questions.   [name], MD Pager: [phone] Office: [phone]

## 2019-11-14 ENCOUNTER — Ambulatory Visit: Payer: Medicare PPO | Admitting: Cardiology

## 2019-11-14 ENCOUNTER — Encounter: Payer: Self-pay | Admitting: Cardiology

## 2019-11-14 ENCOUNTER — Ambulatory Visit: Payer: Medicare PPO

## 2019-11-14 ENCOUNTER — Other Ambulatory Visit: Payer: Self-pay

## 2019-11-14 VITALS — BP 173/84 | HR 73 | Resp 16 | Ht 61.0 in | Wt 167.5 lb

## 2019-11-14 DIAGNOSIS — I10 Essential (primary) hypertension: Secondary | ICD-10-CM | POA: Diagnosis not present

## 2019-11-14 DIAGNOSIS — I493 Ventricular premature depolarization: Secondary | ICD-10-CM

## 2019-11-14 DIAGNOSIS — R002 Palpitations: Secondary | ICD-10-CM | POA: Diagnosis not present

## 2019-11-14 DIAGNOSIS — R008 Other abnormalities of heart beat: Secondary | ICD-10-CM | POA: Diagnosis not present

## 2019-11-15 ENCOUNTER — Ambulatory Visit: Payer: Self-pay | Admitting: Cardiology

## 2019-11-19 ENCOUNTER — Ambulatory Visit: Payer: Medicare PPO

## 2019-11-19 ENCOUNTER — Other Ambulatory Visit: Payer: Self-pay

## 2019-11-19 DIAGNOSIS — I493 Ventricular premature depolarization: Secondary | ICD-10-CM | POA: Diagnosis not present

## 2019-11-22 ENCOUNTER — Other Ambulatory Visit: Payer: Self-pay

## 2019-11-22 ENCOUNTER — Ambulatory Visit: Payer: Medicare PPO

## 2019-11-22 DIAGNOSIS — I10 Essential (primary) hypertension: Secondary | ICD-10-CM | POA: Diagnosis not present

## 2019-11-22 DIAGNOSIS — I493 Ventricular premature depolarization: Secondary | ICD-10-CM | POA: Diagnosis not present

## 2019-11-23 NOTE — Progress Notes (Signed)
Unable to reach pt. Left vm to cb. 

## 2019-11-23 NOTE — Progress Notes (Signed)
Spoke with patient. Patient voiced understanding.

## 2019-11-23 NOTE — Progress Notes (Signed)
Spoke to patient regarding results patient states she is still feeling irregular heartbeat

## 2019-11-23 NOTE — Progress Notes (Signed)
She does have extra beats on the EKG. Will discuss further during visit next week, after reviewing monitor data.  Thanks MJP

## 2019-11-30 ENCOUNTER — Encounter: Payer: Self-pay | Admitting: Cardiology

## 2019-11-30 ENCOUNTER — Ambulatory Visit: Payer: Medicare PPO | Admitting: Cardiology

## 2019-11-30 ENCOUNTER — Other Ambulatory Visit: Payer: Self-pay

## 2019-11-30 VITALS — BP 171/87 | HR 73 | Resp 16 | Ht 61.0 in | Wt 167.0 lb

## 2019-11-30 DIAGNOSIS — I10 Essential (primary) hypertension: Secondary | ICD-10-CM | POA: Diagnosis not present

## 2019-11-30 DIAGNOSIS — R002 Palpitations: Secondary | ICD-10-CM | POA: Diagnosis not present

## 2019-11-30 DIAGNOSIS — I493 Ventricular premature depolarization: Secondary | ICD-10-CM

## 2019-11-30 MED ORDER — METOPROLOL TARTRATE 50 MG PO TABS: 50.0000 mg | ORAL_TABLET | Freq: Two times a day (BID) | ORAL | 3 refills | Status: DC

## 2019-11-30 NOTE — Progress Notes (Signed)
Patient referred by Alroy Dust, L.Marlou Sa, MD for [facility]  Subjective:   Kellie Case, female    DOB: [birth year], 64 y.o.   MRN: [id number]   Chief Complaint  Patient presents with   PVC   Follow-up    4 week   Results    monitor     HPI  64 y.o. Caucasian female with hypertension,hypothyroidism, acquired solitary kidney s/p nephrectomy, frequent PVC's  Monitor results are pending. Discussed echocardiogram and exercise treadmill stress testing results discussed with the patient, details below. Her blood pressure continues to be elevated. She is very concerned and anxious [id number].   Initial consultation HPI 11/14/2019: Patient is retired, stays active with [facility] [employer].  She does not have any exercise induced chest pain, shortness of breath, palpitation symptoms.  Recently, she was incidentally found to have frequent PVCs on EKG.  Patient noted that her heart rate would read in 40s at times on monitor.  She has noticed that her blood pressure has continued to increase since she had Covid in January 2021.  She is currently on lisinopril 20 mg daily.  She wonders if her PVCs are related to lisinopril.   Current Outpatient Medications on File Prior to Visit  Medication Sig Dispense Refill   CALCIUM PO Take 1 tablet by mouth daily.      Estradiol 10 MCG TABS vaginal tablet Take 1 tablet by mouth 2 (two) times a week.     levothyroxine (SYNTHROID) 75 MCG tablet Take 75 mcg by mouth daily.      lisinopril (ZESTRIL) 20 MG tablet Take 20 mg by mouth daily.     Multiple Vitamin (MULTIVITAMIN) tablet Take 1 tablet by mouth daily.     Polyethyl Glycol-Propyl Glycol (SYSTANE OP) Place 1 drop into both eyes every morning.     No current facility-administered medications on file prior to visit.    Cardiovascular and other pertinent studies:  Echocardiogram 11/22/2019:  Left ventricle cavity is normal in size and wall thickness. Normal global  wall motion. Frequent  ectopy seen. Normal LV systolic function with EF  56%. Indeterminate diastolic filling pattern.  No significant valvular abnormality seen.  Normal right atrial pressure.   Exercise treadmill stress test 11/19/2019: Exercise treadmill stress test performed using Bruce protocol.  Patient reached 8.5 METS, and 88% of age predicted maximum heart rate.  Exercise capacity was good.  No chest pain reported.  Normal heart rate and hemodynamic response.  Resting ECG demonstrated normal sinus rhythm, frequent PVC,s no ST-T wave abnormalities Peak ECG demonstrated sinus tachycardia, no ST-T wave abnormalities. PVC's improved with exercise.  Low risk study.   EKG 11/14/2019: Sinus rhythm 73 bpm Normal EKG  EKG 10/31/2019: Sinus rhythm 66 bpm  Frequent PVCs   Recent labs: 02/04/2019: Glucose 104, BUN/Cr 16/0.89. EGFR >60. Na/K 140/4.2. Rest of the CMP normal H/H 13/40. MCV 89. Platelets 221 TSH N/A BNP 27, trop HS 2 CRP 3.7   Review of Systems  Cardiovascular: Negative for chest pain, dyspnea on exertion, leg swelling, palpitations and syncope.  Psychiatric/Behavioral: The patient is nervous/anxious.          Vitals:   11/30/19 1150  BP: (!) 171/87  Pulse: 73  Resp: 16  SpO2: 98%     Body mass index is 31.55 kg/m. Filed Weights   11/30/19 1150  Weight: 167 lb (75.8 kg)     Objective:   Physical Exam Vitals and nursing note reviewed.  Constitutional:  General: She is not in acute distress. Neck:     Vascular: No JVD.  Cardiovascular:     Rate and Rhythm: Normal rate and regular rhythm.     Heart sounds: Normal heart sounds. No murmur heard.   Pulmonary:     Effort: Pulmonary effort is normal.     Breath sounds: Normal breath sounds. No wheezing or rales.          Assessment & Recommendations:   64 y.o. Caucasian female with hypertension,hypothyroidism, acquired solitary kidney s/p nephrectomy, frequent PVC's  Frequent PVCs: Structurally normal  heart. Exercise treadmill testing with frequent PVC's, but no ischemia. Monitor results pending.  Started metoprolol tartarate 50 mg bid. Reduce lisinopril to 10 mg daily. This can be stopped, if BP is consistently <130/80 mmHg.  Repeat monitor in 6 weeks. Follow up after that.    [name], MD Pager: [phone] Office: [phone]

## 2019-11-30 NOTE — Progress Notes (Signed)
20% PVC burden.  Started the patient on metoprolol tartrate 50 mg twice daily today.  We will repeat monitor in 6 weeks to see if there is any improvement.  [name], MD Pager: [phone] Office: [phone]

## 2019-12-07 ENCOUNTER — Other Ambulatory Visit: Payer: Self-pay

## 2019-12-07 MED ORDER — AMLODIPINE BESYLATE 5 MG PO TABS: 5.0000 mg | ORAL_TABLET | Freq: Every day | ORAL | 3 refills | Status: DC

## 2019-12-12 ENCOUNTER — Ambulatory Visit: Payer: Medicare PPO | Admitting: Cardiology

## 2020-01-01 ENCOUNTER — Telehealth: Payer: Self-pay

## 2020-01-01 NOTE — Telephone Encounter (Signed)
Go ahead and place the monitor sooner and move her appt up to be seen sometime in mid to late December

## 2020-01-01 NOTE — Telephone Encounter (Signed)
Telephone encounter:  Reason for call: Pt called and said that she is still having pvc symptoms, bp is coming down. Pt monitor is not scheduled until 01/11/20   Usual provider: MP  Last office visit: 11/30/19  Next office visit: 02/08/19   Last hospitalization: na  Current Outpatient Medications on File Prior to Visit  Medication Sig Dispense Refill  . amLODipine (NORVASC) 5 MG tablet Take 1 tablet (5 mg total) by mouth daily. 30 tablet 3  . CALCIUM PO Take 1 tablet by mouth daily.     . Estradiol 10 MCG TABS vaginal tablet Take 1 tablet by mouth 2 (two) times a week.    . levothyroxine (SYNTHROID) 75 MCG tablet Take 75 mcg by mouth daily.     Marland Kitchen lisinopril (ZESTRIL) 20 MG tablet Take 10 mg by mouth daily.    . metoprolol tartrate (LOPRESSOR) 50 MG tablet Take 1 tablet (50 mg total) by mouth 2 (two) times daily. 60 tablet 3  . Multiple Vitamin (MULTIVITAMIN) tablet Take 1 tablet by mouth daily.    [name] Glycol-Propyl Glycol (SYSTANE OP) Place 1 drop into both eyes every morning.     No current facility-administered medications on file prior to visit.

## 2020-01-04 ENCOUNTER — Other Ambulatory Visit: Payer: Self-pay

## 2020-01-04 ENCOUNTER — Ambulatory Visit: Payer: Medicare PPO

## 2020-01-04 ENCOUNTER — Other Ambulatory Visit: Payer: Medicare PPO

## 2020-01-04 DIAGNOSIS — I493 Ventricular premature depolarization: Secondary | ICD-10-CM | POA: Diagnosis not present

## 2020-01-11 ENCOUNTER — Ambulatory Visit: Payer: Medicare PPO

## 2020-01-24 DIAGNOSIS — I493 Ventricular premature depolarization: Secondary | ICD-10-CM | POA: Diagnosis not present

## 2020-02-04 DIAGNOSIS — I493 Ventricular premature depolarization: Secondary | ICD-10-CM | POA: Diagnosis not present

## 2020-02-07 DIAGNOSIS — Z20828 Contact with and (suspected) exposure to other viral communicable diseases: Secondary | ICD-10-CM | POA: Diagnosis not present

## 2020-02-07 NOTE — Progress Notes (Signed)
Patient referred by Alroy Dust, L.Marlou Sa, MD for [facility]  Subjective:   Kellie Case, female    DOB: Sep 01, 1955, 65 y.o.   MRN: [id number]   Chief Complaint  Patient presents with   PVC   Follow-up     HPI  65 y.o. Caucasian female with hypertension,hypothyroidism, acquired solitary kidney s/p nephrectomy, frequent PVC's  Patient continues to have palpitations symptoms. PVC burden slightly from 20% to 16% on Her palpitations symptoms have improved. Blood pressure is well cotnrolled Heart rate runs in 40s.  Current Outpatient Medications on File Prior to Visit  Medication Sig Dispense Refill   amLODipine (NORVASC) 5 MG tablet Take 1 tablet (5 mg total) by mouth daily. 30 tablet 3   CALCIUM PO Take 1 tablet by mouth daily.      Estradiol 10 MCG TABS vaginal tablet Take 1 tablet by mouth 2 (two) times a week.     levothyroxine (SYNTHROID) 75 MCG tablet Take 75 mcg by mouth daily.      lisinopril (ZESTRIL) 20 MG tablet Take 10 mg by mouth daily.     metoprolol tartrate (LOPRESSOR) 50 MG tablet Take 1 tablet (50 mg total) by mouth 2 (two) times daily. 60 tablet 3   Multiple Vitamin (MULTIVITAMIN) tablet Take 1 tablet by mouth daily.     Polyethyl Glycol-Propyl Glycol (SYSTANE OP) Place 1 drop into both eyes every morning.     No current facility-administered medications on file prior to visit.    Cardiovascular and other pertinent studies:  EKG 02/08/2020: Sinus bradycardia 43 bpm   Mobile cardiac telemetry 13 days 01/04/2020 - 01/18/2020: Dominant rhythm: Sinus. HR 39-148 bpm. Avg HR 67 bpm. 5 episodes of atrial tachycardia, fastest at 148 bpm for 4 beats, longest for 12 beats at 104 bpm. Rare isolated SVE, couplet/triplets. 4 episodes of VT, fastest and longest at 136 bpm for 5 beats 16.3% isolated VE, including bigeminy and trigeminy, rare couplet/triplets. No atrial fibrillation/atrial flutter//high grade AV block, sinus pause >3sec noted.     6 patient triggered  events, most correlate with ventricular ectopy.     Mobile cardiac telemetry 7 days 11/14/2019 - 11/21/2019: Dominant rhythm: Sinus. HR 44-144 bpm. Avg HR 81 bpm. 3 episodes of SVT, fastest at 141 bpm for 6 beats. <1% isolated SVE, couplet/triplets. 10 episodes of VT, fastest at 144 bpm for 4 beats, longest for 5 beats at 104 bpm. 20% isolated VE, 1.6% couplets, <1% triplets. No atrial fibrillation/atrial flutter/high grade AV block, sinus pause >3sec noted.   Echocardiogram 11/22/2019:  Left ventricle cavity is normal in size and wall thickness. Normal global  wall motion. Frequent ectopy seen. Normal LV systolic function with EF  56%. Indeterminate diastolic filling pattern.  No significant valvular abnormality seen.  Normal right atrial pressure.   Exercise treadmill stress test 11/19/2019: Exercise treadmill stress test performed using Bruce protocol.  Patient reached 8.5 METS, and 88% of age predicted maximum heart rate.  Exercise capacity was good.  No chest pain reported.  Normal heart rate and hemodynamic response.  Resting ECG demonstrated normal sinus rhythm, frequent PVC,s no ST-T wave abnormalities Peak ECG demonstrated sinus tachycardia, no ST-T wave abnormalities. PVC's improved with exercise.  Low risk study.   EKG 11/14/2019: Sinus rhythm 73 bpm Normal EKG  EKG 10/31/2019: Sinus rhythm 66 bpm  Frequent PVCs   Recent labs: 02/04/2019: Glucose 104, BUN/Cr 16/0.89. EGFR >60. Na/K 140/4.2. Rest of the CMP normal H/H 13/40. MCV 89. Platelets 221 TSH N/A BNP  27, trop HS 2 CRP 3.7   Review of Systems  Cardiovascular: Negative for chest pain, dyspnea on exertion, leg swelling, palpitations and syncope.  Psychiatric/Behavioral: The patient is nervous/anxious.          Vitals:   02/08/20 0939  BP: 137/67  Pulse: (!) 45  SpO2: 98%    ** Body mass index is 31.52 kg/m. Filed Weights   02/08/20 0939  Weight: 166 lb 12.8 oz (75.7 kg)     Objective:    Physical Exam Vitals and nursing note reviewed.  Constitutional:      General: She is not in acute distress. Neck:     Vascular: No JVD.  Cardiovascular:     Rate and Rhythm: Normal rate and regular rhythm.     Heart sounds: Normal heart sounds. No murmur heard.   Pulmonary:     Effort: Pulmonary effort is normal.     Breath sounds: Normal breath sounds. No wheezing or rales.          Assessment & Recommendations:   65 y.o. Caucasian female with hypertension,hypothyroidism, acquired solitary kidney s/p nephrectomy, frequent PVC's  Symptomatic PVCs: Structurally normal heart. Exercise treadmill testing with frequent PVC's, but no ischemia.  PVC burden reduced only slightly, from 20% to 16% on metoprolol tartarate 50 mg bid. Rest EKG today with HR in 40s without a single PVC Switch toi metoprolol succinate 100 mg daily, for convenience. Gave prescription of lisinopril 10 mg for hypertension.  F/u in 3 months   [name], MD Pager: [phone] Office: [phone]

## 2020-02-08 ENCOUNTER — Ambulatory Visit: Payer: Medicare PPO | Admitting: Cardiology

## 2020-02-08 ENCOUNTER — Encounter: Payer: Self-pay | Admitting: Cardiology

## 2020-02-08 VITALS — BP 137/67 | HR 45 | Ht 61.0 in | Wt 166.8 lb

## 2020-02-08 DIAGNOSIS — I493 Ventricular premature depolarization: Secondary | ICD-10-CM

## 2020-02-08 DIAGNOSIS — I10 Essential (primary) hypertension: Secondary | ICD-10-CM

## 2020-02-08 MED ORDER — METOPROLOL SUCCINATE ER 100 MG PO TB24: 100.0000 mg | ORAL_TABLET | Freq: Every day | ORAL | 3 refills | Status: DC

## 2020-02-08 MED ORDER — LISINOPRIL 10 MG PO TABS: 10.0000 mg | ORAL_TABLET | Freq: Every day | ORAL | 3 refills | Status: DC

## 2020-03-13 DIAGNOSIS — Z6830 Body mass index (BMI) 30.0-30.9, adult: Secondary | ICD-10-CM | POA: Diagnosis not present

## 2020-03-13 DIAGNOSIS — Z7689 Persons encountering health services in other specified circumstances: Secondary | ICD-10-CM | POA: Diagnosis not present

## 2020-03-13 DIAGNOSIS — E039 Hypothyroidism, unspecified: Secondary | ICD-10-CM | POA: Diagnosis not present

## 2020-03-13 DIAGNOSIS — I10 Essential (primary) hypertension: Secondary | ICD-10-CM | POA: Diagnosis not present

## 2020-03-13 DIAGNOSIS — Z1231 Encounter for screening mammogram for malignant neoplasm of breast: Secondary | ICD-10-CM | POA: Diagnosis not present

## 2020-03-14 ENCOUNTER — Other Ambulatory Visit: Payer: Self-pay

## 2020-03-18 DIAGNOSIS — N1831 Chronic kidney disease, stage 3a: Secondary | ICD-10-CM | POA: Diagnosis not present

## 2020-03-18 DIAGNOSIS — R7303 Prediabetes: Secondary | ICD-10-CM | POA: Diagnosis not present

## 2020-03-18 DIAGNOSIS — E7800 Pure hypercholesterolemia, unspecified: Secondary | ICD-10-CM | POA: Diagnosis not present

## 2020-03-18 DIAGNOSIS — I10 Essential (primary) hypertension: Secondary | ICD-10-CM | POA: Diagnosis not present

## 2020-03-25 ENCOUNTER — Other Ambulatory Visit: Payer: Self-pay

## 2020-03-25 MED ORDER — AMLODIPINE BESYLATE 5 MG PO TABS: 5.0000 mg | ORAL_TABLET | Freq: Every day | ORAL | 0 refills | Status: DC

## 2020-05-18 NOTE — Progress Notes (Signed)
Patient referred by Kellie Case, Kellie Sa, MD for [facility]  Subjective:   Kellie Case, female    DOB: 05/19/1955, 65 y.o.   MRN: [id number]   Chief Complaint  Patient presents with  . premature ventricular contraction  . Follow-up    3 month     HPI  65 y.o. Caucasian female with hypertension,hypothyroidism, acquired solitary kidney s/p nephrectomy, symptomatic PVC's  Patient is feeling much better.  Blood pressures well controlled.  Reported "anxiety" episodes happen completely resolved.  Blood pressure monitor reported "irregular heart rhythm" only 1-2 times in the last several months.   Current Outpatient Medications on File Prior to Visit  Medication Sig Dispense Refill  . amLODipine (NORVASC) 5 MG tablet Take 1 tablet (5 mg total) by mouth daily. 90 tablet 0  . CALCIUM PO Take 1 tablet by mouth daily.     . Estradiol 10 MCG TABS vaginal tablet Take 1 tablet by mouth 2 (two) times a week.    . levothyroxine (SYNTHROID) 75 MCG tablet Take 75 mcg by mouth daily.     Marland Kitchen lisinopril (ZESTRIL) 20 MG tablet Take 10 mg by mouth daily.    . metoprolol succinate (TOPROL-XL) 100 MG 24 hr tablet Take 1 tablet (100 mg total) by mouth daily with supper. Take with or immediately following a meal. 90 tablet 3  . Multiple Vitamin (MULTIVITAMIN) tablet Take 1 tablet by mouth daily.    [name] Glycol-Propyl Glycol (SYSTANE OP) Place 1 drop into both eyes every morning.     No current facility-administered medications on file prior to visit.    Cardiovascular and other pertinent studies:  EKG 05/19/2020: Sinus bradycardia 46 bpm  Borderline left atrial enlargement   Mobile cardiac telemetry 13 days 01/04/2020 - 01/18/2020: Dominant rhythm: Sinus. HR 39-148 bpm. Avg HR 67 bpm. 5 episodes of atrial tachycardia, fastest at 148 bpm for 4 beats, longest for 12 beats at 104 bpm. Rare isolated SVE, couplet/triplets. 4 episodes of VT, fastest and longest at 136 bpm for 5 beats 16.3%  isolated VE, including bigeminy and trigeminy, rare couplet/triplets. No atrial fibrillation/atrial flutter//high grade AV block, sinus pause >3sec noted.     6 patient triggered events, most correlate with ventricular ectopy.     Mobile cardiac telemetry 7 days 11/14/2019 - 11/21/2019: Dominant rhythm: Sinus. HR 44-144 bpm. Avg HR 81 bpm. 3 episodes of SVT, fastest at 141 bpm for 6 beats. <1% isolated SVE, couplet/triplets. 10 episodes of VT, fastest at 144 bpm for 4 beats, longest for 5 beats at 104 bpm. 20% isolated VE, 1.6% couplets, <1% triplets. No atrial fibrillation/atrial flutter/high grade AV block, sinus pause >3sec noted.   Echocardiogram 11/22/2019:  Left ventricle cavity is normal in size and wall thickness. Normal global  wall motion. Frequent ectopy seen. Normal LV systolic function with EF  56%. Indeterminate diastolic filling pattern.  No significant valvular abnormality seen.  Normal right atrial pressure.   Exercise treadmill stress test 11/19/2019: Exercise treadmill stress test performed using Bruce protocol.  Patient reached 8.5 METS, and 88% of age predicted maximum heart rate.  Exercise capacity was good.  No chest pain reported.  Normal heart rate and hemodynamic response.  Resting ECG demonstrated normal sinus rhythm, frequent PVC,s no ST-T wave abnormalities Peak ECG demonstrated sinus tachycardia, no ST-T wave abnormalities. PVC's improved with exercise.  Low risk study.   EKG 11/14/2019: Sinus rhythm 73 bpm Normal EKG  EKG 10/31/2019: Sinus rhythm 66 bpm  Frequent PVCs  Recent labs: 02/04/2019: Glucose 104, BUN/Cr 16/0.89. EGFR >60. Na/K 140/4.2. Rest of the CMP normal H/H 13/40. MCV 89. Platelets 221 TSH N/A BNP 27, trop HS 2 CRP 3.7   Review of Systems  Cardiovascular: Negative for chest pain, dyspnea on exertion, leg swelling, palpitations and syncope.  Psychiatric/Behavioral: The patient is nervous/anxious.          Vitals:    05/19/20 1018  BP: 126/64  Pulse: (!) 57  Resp: 16  Temp: 97.8 F (36.6 C)  SpO2: 96%     Body mass index is 30.23 kg/m. Filed Weights   05/19/20 1018  Weight: 160 lb (72.6 kg)     Objective:   Physical Exam Vitals and nursing note reviewed.  Constitutional:      General: She is not in acute distress. Neck:     Vascular: No JVD.  Cardiovascular:     Rate and Rhythm: Normal rate and regular rhythm.     Heart sounds: Normal heart sounds. No murmur heard.   Pulmonary:     Effort: Pulmonary effort is normal.     Breath sounds: Normal breath sounds. No wheezing or rales.          Assessment & Recommendations:   65 y.o. Caucasian female with hypertension,hypothyroidism, acquired solitary kidney s/p nephrectomy, frequent PVC's  Symptomatic PVCs: Structurally normal heart. Exercise treadmill testing with frequent PVC's, but no ischemia.  PVC burden reduced only slightly, from 20% to 16% (01/2020) on metoprolol tartarate 50 mg bid. Rest EKG today with HR in 40s without a single PVC Continue metoprolol succinate 100 mg daily, lisinopril 10 mg daily.  F/u in 6 months   [name], MD Pager: [phone] Office: [phone]

## 2020-05-19 ENCOUNTER — Other Ambulatory Visit: Payer: Self-pay

## 2020-05-19 ENCOUNTER — Ambulatory Visit: Payer: Medicare PPO | Admitting: Cardiology

## 2020-05-19 ENCOUNTER — Encounter: Payer: Self-pay | Admitting: Cardiology

## 2020-05-19 VITALS — BP 126/64 | HR 57 | Temp 97.8°F | Resp 16 | Ht 61.0 in | Wt 160.0 lb

## 2020-05-19 DIAGNOSIS — I493 Ventricular premature depolarization: Secondary | ICD-10-CM | POA: Diagnosis not present

## 2020-05-19 DIAGNOSIS — I10 Essential (primary) hypertension: Secondary | ICD-10-CM | POA: Diagnosis not present

## 2020-05-20 DIAGNOSIS — E7800 Pure hypercholesterolemia, unspecified: Secondary | ICD-10-CM | POA: Diagnosis not present

## 2020-06-02 DIAGNOSIS — Z85528 Personal history of other malignant neoplasm of kidney: Secondary | ICD-10-CM | POA: Diagnosis not present

## 2020-06-02 DIAGNOSIS — Z7722 Contact with and (suspected) exposure to environmental tobacco smoke (acute) (chronic): Secondary | ICD-10-CM | POA: Diagnosis not present

## 2020-06-02 DIAGNOSIS — C642 Malignant neoplasm of left kidney, except renal pelvis: Secondary | ICD-10-CM | POA: Diagnosis not present

## 2020-06-02 DIAGNOSIS — Z905 Acquired absence of kidney: Secondary | ICD-10-CM | POA: Diagnosis not present

## 2020-06-25 DIAGNOSIS — L814 Other melanin hyperpigmentation: Secondary | ICD-10-CM | POA: Diagnosis not present

## 2020-06-25 DIAGNOSIS — L821 Other seborrheic keratosis: Secondary | ICD-10-CM | POA: Diagnosis not present

## 2020-06-25 DIAGNOSIS — L905 Scar conditions and fibrosis of skin: Secondary | ICD-10-CM | POA: Diagnosis not present

## 2020-06-25 DIAGNOSIS — L812 Freckles: Secondary | ICD-10-CM | POA: Diagnosis not present

## 2020-06-25 DIAGNOSIS — L820 Inflamed seborrheic keratosis: Secondary | ICD-10-CM | POA: Diagnosis not present

## 2020-06-29 ENCOUNTER — Other Ambulatory Visit: Payer: Self-pay | Admitting: Cardiology

## 2020-07-01 ENCOUNTER — Other Ambulatory Visit: Payer: Self-pay

## 2020-07-01 MED ORDER — AMLODIPINE BESYLATE 5 MG PO TABS: 5.0000 mg | ORAL_TABLET | Freq: Every day | ORAL | 1 refills | Status: DC

## 2020-07-10 DIAGNOSIS — H35363 Drusen (degenerative) of macula, bilateral: Secondary | ICD-10-CM | POA: Diagnosis not present

## 2020-07-10 DIAGNOSIS — H2513 Age-related nuclear cataract, bilateral: Secondary | ICD-10-CM | POA: Diagnosis not present

## 2020-07-10 DIAGNOSIS — H40033 Anatomical narrow angle, bilateral: Secondary | ICD-10-CM | POA: Diagnosis not present

## 2020-07-10 DIAGNOSIS — H04123 Dry eye syndrome of bilateral lacrimal glands: Secondary | ICD-10-CM | POA: Diagnosis not present

## 2020-10-08 DIAGNOSIS — Z23 Encounter for immunization: Secondary | ICD-10-CM | POA: Diagnosis not present

## 2020-10-08 DIAGNOSIS — Z0000 Encounter for general adult medical examination without abnormal findings: Secondary | ICD-10-CM | POA: Diagnosis not present

## 2020-10-08 DIAGNOSIS — I10 Essential (primary) hypertension: Secondary | ICD-10-CM | POA: Diagnosis not present

## 2020-10-08 DIAGNOSIS — T148XXA Other injury of unspecified body region, initial encounter: Secondary | ICD-10-CM | POA: Diagnosis not present

## 2020-10-08 DIAGNOSIS — E7800 Pure hypercholesterolemia, unspecified: Secondary | ICD-10-CM | POA: Diagnosis not present

## 2020-11-19 ENCOUNTER — Encounter: Payer: Self-pay | Admitting: Cardiology

## 2020-11-19 ENCOUNTER — Ambulatory Visit: Payer: Medicare PPO | Admitting: Cardiology

## 2020-11-19 ENCOUNTER — Other Ambulatory Visit: Payer: Self-pay

## 2020-11-19 VITALS — BP 172/72 | HR 50 | Temp 97.8°F | Resp 16 | Ht 61.0 in | Wt 167.0 lb

## 2020-11-19 DIAGNOSIS — I10 Essential (primary) hypertension: Secondary | ICD-10-CM

## 2020-11-19 DIAGNOSIS — E782 Mixed hyperlipidemia: Secondary | ICD-10-CM | POA: Diagnosis not present

## 2020-11-19 DIAGNOSIS — I493 Ventricular premature depolarization: Secondary | ICD-10-CM | POA: Diagnosis not present

## 2020-11-19 NOTE — Progress Notes (Signed)
Patient referred by Alroy Dust, L.Marlou Sa, MD for [facility]  Subjective:   Kellie Case, female    DOB: Jan 30, 1956, 65 y.o.   MRN: [id number]   Chief Complaint  Patient presents with   PVC (premature ventricular contraction)   Follow-up    6 month     HPI  65 y.o. Caucasian female with hypertension,hypothyroidism, acquired solitary kidney s/p nephrectomy, symptomatic PVC's  Patient has not had any palpitation symptoms.  Blood pressure elevated today, but historically not normal at home.  Patient is very concerned about her blood pressure given her solitary kidney.  Current Outpatient Medications on File Prior to Visit  Medication Sig Dispense Refill   amLODipine (NORVASC) 5 MG tablet Take 1 tablet (5 mg total) by mouth daily. 90 tablet 1   CALCIUM PO Take 1 tablet by mouth daily.      Estradiol 10 MCG INST 1 _insert     Estradiol 10 MCG TABS vaginal tablet Take 1 tablet by mouth 2 (two) times a week.     levothyroxine (SYNTHROID) 75 MCG tablet Take 75 mcg by mouth daily.      lisinopril (ZESTRIL) 20 MG tablet Take 10 mg by mouth daily.     metoprolol succinate (TOPROL-XL) 100 MG 24 hr tablet Take 1 tablet (100 mg total) by mouth daily with supper. Take with or immediately following a meal. 90 tablet 3   Multiple Vitamin (MULTIVITAMIN) tablet Take 1 tablet by mouth daily.     Polyethyl Glycol-Propyl Glycol (SYSTANE OP) Place 1 drop into both eyes every morning.     Red Yeast Rice Extract (RED YEAST RICE PO) Take 1 tablet by mouth daily.     No current facility-administered medications on file prior to visit.    Cardiovascular and other pertinent studies:  EKG 11/19/2020: Sinus bradycardia 47 bpm Low voltage in precordial leads  Mobile cardiac telemetry 13 days 01/04/2020 - 01/18/2020: Dominant rhythm: Sinus. HR 39-148 bpm. Avg HR 67 bpm. 5 episodes of atrial tachycardia, fastest at 148 bpm for 4 beats, longest for 12 beats at 104 bpm. Rare isolated SVE, couplet/triplets. 4  episodes of VT, fastest and longest at 136 bpm for 5 beats 16.3% isolated VE, including bigeminy and trigeminy, rare couplet/triplets. No atrial fibrillation/atrial flutter//high grade AV block, sinus pause >3sec noted.     6 patient triggered events, most correlate with ventricular ectopy.    Mobile cardiac telemetry 7 days 11/14/2019 - 11/21/2019: Dominant rhythm: Sinus. HR 44-144 bpm. Avg HR 81 bpm. 3 episodes of SVT, fastest at 141 bpm for 6 beats. <1% isolated SVE, couplet/triplets. 10 episodes of VT, fastest at 144 bpm for 4 beats, longest for 5 beats at 104 bpm. 20% isolated VE, 1.6% couplets, <1% triplets. No atrial fibrillation/atrial flutter/high grade AV block, sinus pause >3sec noted.   Echocardiogram 11/22/2019:  Left ventricle cavity is normal in size and wall thickness. Normal global  wall motion. Frequent ectopy seen.  Normal LV systolic function with EF  56%. Indeterminate diastolic filling pattern.  No significant valvular abnormality seen.  Normal right atrial pressure.   Exercise treadmill stress test 11/19/2019: Exercise treadmill stress test performed using Bruce protocol.  Patient reached 8.5 METS, and 88% of age predicted maximum heart rate.  Exercise capacity was good.  No chest pain reported.  Normal heart rate and hemodynamic response.  Resting ECG demonstrated normal sinus rhythm, frequent PVC,s no ST-T wave abnormalities Peak ECG demonstrated sinus tachycardia, no ST-T wave abnormalities. PVC's improved with exercise.  Low risk study.   EKG 11/14/2019: Sinus rhythm 73 bpm Normal EKG  EKG 10/31/2019: Sinus rhythm 66 bpm  Frequent PVCs   Recent labs: 10/08/2020: Glucose 88, BUN/Cr 20/1.0. EGFR 60.  Chol 221, TG 195, HDL 45, LDL 141  03/2020: HbA1C 5.7% TSH 4.1 normal  02/04/2019: Glucose 104, BUN/Cr 16/0.89. EGFR >60. Na/K 140/4.2. Rest of the CMP normal H/H 13/40. MCV 89. Platelets 221 TSH N/A BNP 27, trop HS 2 CRP 3.7   Review of Systems   Cardiovascular:  Negative for chest pain, dyspnea on exertion, leg swelling, palpitations and syncope.  Psychiatric/Behavioral:  The patient is nervous/anxious.         Vitals:   11/19/20 1046 11/19/20 1050  BP: (!) 147/66 (!) 172/72  Pulse: (!) 47 (!) 50  Resp: 16   Temp: 97.8 F (36.6 C)   SpO2: 97%      Body mass index is 31.55 kg/m. Filed Weights   11/19/20 1046  Weight: 167 lb (75.8 kg)     Objective:   Physical Exam Vitals and nursing note reviewed.  Constitutional:      General: She is not in acute distress. Neck:     Vascular: No JVD.  Cardiovascular:     Rate and Rhythm: Normal rate and regular rhythm.     Heart sounds: Normal heart sounds. No murmur heard. Pulmonary:     Effort: Pulmonary effort is normal.     Breath sounds: Normal breath sounds. No wheezing or rales.  Musculoskeletal:     Right lower leg: No edema.     Left lower leg: No edema.         Assessment & Recommendations:   65 y.o. Caucasian female with hypertension,hypothyroidism, acquired solitary kidney s/p nephrectomy, frequent PVC's  Symptomatic PVCs: Structurally normal heart. Exercise treadmill testing with frequent PVC's, but no ischemia.  PVC burden reduced only slightly, from 20% to 16% (01/2020) on metoprolol tartarate 50 mg bid. Symptomatically much better. Rest EKG today with HR in 40s without a single PVC Continue metoprolol succinate 100 mg daily, lisinopril 10 mg daily.  Hypertension: Generally normal at home, suspect whitecoat hypertension.  Patient would like to follow-up visit with comparison of home blood pressure monitor and office monitor.  If blood pressure consistently >140/90 mmHg, could cautiously increase lisinopril to 20 mg daily and check BMP in 1 week.  Alternatively, could keep lisinopril at 10 mg daily, and add amlodipine 5 mg daily.  Mixed hyperlipidemia: LDL 141. 10 yr ASCVD risk 7.5% Patient is not sure about starting statin, was previously on red  yeast extract. Will obtain CT cardiac scoring for certification.  If calcium score elevated, recommend statin.  F/u in 2 weeks with Lawerance Cruel, PA    [name], MD Pager: [phone] Office: [phone]

## 2020-11-20 ENCOUNTER — Telehealth: Payer: Self-pay | Admitting: Student

## 2020-11-20 NOTE — Telephone Encounter (Signed)
Patient would like to know the results from her monitor. Please advise.

## 2020-11-20 NOTE — Telephone Encounter (Signed)
She hasn't had any recent monitor test. Last was in 01/2020 that showed 16% PVCs, down from 28% before.  Thanks MJP

## 2020-11-20 NOTE — Telephone Encounter (Signed)
Patient says she was told to look on mychart for results from her monitor, but she didn't see any and wanted to double check if there were results or not.

## 2020-11-21 NOTE — Telephone Encounter (Signed)
Called and spoke to pt, pt meant to say EKG. She would like to know the results of her most recent EKG.

## 2020-11-25 NOTE — Telephone Encounter (Signed)
Low, but not worrisome, heart rate, this is due to the medications she is on.   Thanks MJP

## 2020-11-26 NOTE — Telephone Encounter (Signed)
Patient called back, I have discussed results with her.

## 2020-11-26 NOTE — Telephone Encounter (Signed)
Called pt, no answer. Left vm requesting call back?

## 2020-12-01 ENCOUNTER — Telehealth: Payer: Self-pay | Admitting: *Deleted

## 2020-12-01 NOTE — Telephone Encounter (Signed)
Pt aware of recommendations

## 2020-12-01 NOTE — Telephone Encounter (Signed)
I recommend she be seen by primary care or urgent care.  She may need an x-ray done.

## 2020-12-01 NOTE — Telephone Encounter (Signed)
Pt complains of falling on Friday. Pt has been experiencing left side pelvic pain. Severity 6. Pt also complains of a prolapse as well. No discharge itching or odor. Patient wants to know if she should be seen by [place] our if she should go to Primary care. I will route to Provider for recommendations

## 2020-12-03 ENCOUNTER — Ambulatory Visit: Payer: Medicare PPO | Admitting: Student

## 2020-12-04 ENCOUNTER — Other Ambulatory Visit: Payer: Self-pay

## 2020-12-04 ENCOUNTER — Ambulatory Visit
Admission: RE | Admit: 2020-12-04 | Discharge: 2020-12-04 | Disposition: A | Discharge status: Home / Self Care | Payer: No Typology Code available for payment source | Source: Ambulatory Visit | Attending: Cardiology | Admitting: Cardiology

## 2020-12-04 DIAGNOSIS — E782 Mixed hyperlipidemia: Secondary | ICD-10-CM

## 2020-12-10 ENCOUNTER — Ambulatory Visit: Payer: Medicare PPO | Admitting: Student

## 2020-12-15 ENCOUNTER — Other Ambulatory Visit: Payer: Self-pay

## 2020-12-15 ENCOUNTER — Encounter: Payer: Self-pay | Admitting: Student

## 2020-12-15 ENCOUNTER — Ambulatory Visit: Payer: Medicare PPO | Admitting: Student

## 2020-12-15 VITALS — BP 144/65 | HR 58 | Temp 98.6°F | Ht 61.0 in | Wt 165.0 lb

## 2020-12-15 DIAGNOSIS — I10 Essential (primary) hypertension: Secondary | ICD-10-CM

## 2020-12-15 DIAGNOSIS — E782 Mixed hyperlipidemia: Secondary | ICD-10-CM | POA: Diagnosis not present

## 2020-12-15 MED ORDER — LISINOPRIL 20 MG PO TABS: 20.0000 mg | ORAL_TABLET | Freq: Every day | ORAL | 3 refills | Status: DC

## 2020-12-15 NOTE — Progress Notes (Signed)
Patient referred by Alroy Dust, L.Marlou Sa, MD for [facility]  Subjective:   Kellie Case, female    DOB: 1956-01-15, 65 y.o.   MRN: [id number]   Chief Complaint  Patient presents with   Hypertension   Follow-up     HPI  65 y.o. Caucasian female with hypertension,hypothyroidism, acquired solitary kidney s/p nephrectomy, symptomatic PVC's.   Patient presents for 2-week follow-up of hypertension.  At last office visit patient had opted to monitor blood pressure at home and review log at her next visit.  Patient brings with her a written log of home blood pressure readings for review.  Blood pressure remains elevated both in the office and on home readings.  Also reviewed and discussed with patient results of coronary calcium scoring, total coronary calcium score was 0.   Patient is asymptomatic from a cardiovascular standpoint.  Denies palpitations despite history of PVCs.  Patient is very concerned about her blood pressure given her solitary kidney.  Current Outpatient Medications on File Prior to Visit  Medication Sig Dispense Refill   amLODipine (NORVASC) 5 MG tablet Take 1 tablet (5 mg total) by mouth daily. 90 tablet 1   CALCIUM PO Take 1 tablet by mouth daily.      Estradiol 10 MCG INST 1 _insert     Estradiol 10 MCG TABS vaginal tablet Take 1 tablet by mouth 2 (two) times a week.     levothyroxine (SYNTHROID) 75 MCG tablet Take 75 mcg by mouth daily.      metoprolol succinate (TOPROL-XL) 100 MG 24 hr tablet Take 1 tablet (100 mg total) by mouth daily with supper. Take with or immediately following a meal. 90 tablet 3   Multiple Vitamin (MULTIVITAMIN) tablet Take 1 tablet by mouth daily.     Polyethyl Glycol-Propyl Glycol (SYSTANE OP) Place 1 drop into both eyes every morning.     Red Yeast Rice Extract (RED YEAST RICE PO) Take 1 tablet by mouth daily.     No current facility-administered medications on file prior to visit.    Cardiovascular and other pertinent  studies: Coronary calcium scoring 12/04/2020: 1. Coronary calcium score is 0. 2. Stable small pulmonary nodules in the visualized lungs.  EKG 11/19/2020: Sinus bradycardia 47 bpm Low voltage in precordial leads  Mobile cardiac telemetry 13 days 01/04/2020 - 01/18/2020: Dominant rhythm: Sinus. HR 39-148 bpm. Avg HR 67 bpm. 5 episodes of atrial tachycardia, fastest at 148 bpm for 4 beats, longest for 12 beats at 104 bpm. Rare isolated SVE, couplet/triplets. 4 episodes of VT, fastest and longest at 136 bpm for 5 beats 16.3% isolated VE, including bigeminy and trigeminy, rare couplet/triplets. No atrial fibrillation/atrial flutter//high grade AV block, sinus pause >3sec noted.     6 patient triggered events, most correlate with ventricular ectopy.    Mobile cardiac telemetry 7 days 11/14/2019 - 11/21/2019: Dominant rhythm: Sinus. HR 44-144 bpm. Avg HR 81 bpm. 3 episodes of SVT, fastest at 141 bpm for 6 beats. <1% isolated SVE, couplet/triplets. 10 episodes of VT, fastest at 144 bpm for 4 beats, longest for 5 beats at 104 bpm. 20% isolated VE, 1.6% couplets, <1% triplets. No atrial fibrillation/atrial flutter/high grade AV block, sinus pause >3sec noted.  Echocardiogram 11/22/2019:  Left ventricle cavity is normal in size and wall thickness. Normal global  wall motion. Frequent ectopy seen.  Normal LV systolic function with EF  56%. Indeterminate diastolic filling pattern.  No significant valvular abnormality seen.  Normal right atrial pressure.   Exercise treadmill  stress test 11/19/2019: Exercise treadmill stress test performed using Bruce protocol.  Patient reached 8.5 METS, and 88% of age predicted maximum heart rate.  Exercise capacity was good.  No chest pain reported.  Normal heart rate and hemodynamic response.  Resting ECG demonstrated normal sinus rhythm, frequent PVC,s no ST-T wave abnormalities Peak ECG demonstrated sinus tachycardia, no ST-T wave abnormalities. PVC's  improved with exercise.  Low risk study.   EKG 11/14/2019: Sinus rhythm 73 bpm Normal EKG  EKG 10/31/2019: Sinus rhythm 66 bpm  Frequent PVCs   Recent labs: CMP Latest Ref Rng & Units 02/04/2019 11/23/2016 11/16/2016  Glucose 70 - 99 mg/dL 104(H) 108(H) 117(H)  BUN 8 - 23 mg/dL 16 9 17   Creatinine 0.44 - 1.00 mg/dL 0.89 0.57 0.98  Sodium 135 - 145 mmol/L 140 139 138  Potassium 3.5 - 5.1 mmol/L 4.2 4.4 4.6  Chloride 98 - 111 mmol/L 103 104 102  CO2 22 - 32 mmol/L 27 29 29   Calcium 8.9 - 10.3 mg/dL 9.3 8.3(L) 9.4  Total Protein 6.5 - 8.1 g/dL 7.1 - 7.0  Total Bilirubin 0.3 - 1.2 mg/dL 0.6 - 0.4  Alkaline Phos 38 - 126 U/L 71 - 89  AST 15 - 41 U/L 21 - 18  ALT 0 - 44 U/L 15 - 12(L)   CBC Latest Ref Rng & Units 02/04/2019 11/29/2016 11/24/2016  WBC 4.0 - 10.5 K/uL 5.3 9.1 11.5(H)  Hemoglobin 12.0 - 15.0 g/dL 13.7 13.0 11.5(L)  Hematocrit 36.0 - 46.0 % 40.9 39.4 34.3(L)  Platelets 150 - 400 K/uL 221 366 225   Lipid Panel  No results found for: CHOL, TRIG, HDL, CHOLHDL, VLDL, LDLCALC, LDLDIRECT HEMOGLOBIN A1C No results found for: HGBA1C, MPG TSH No results for input(s): TSH in the last 8760 hours.  10/08/2020: Glucose 88, BUN/Cr 20/1.0. EGFR 60.  Chol 221, TG 195, HDL 45, LDL 141  03/2020: HbA1C 5.7% TSH 4.1 normal  02/04/2019: Glucose 104, BUN/Cr 16/0.89. EGFR >60. Na/K 140/4.2. Rest of the CMP normal H/H 13/40. MCV 89. Platelets 221 TSH N/A BNP 27, trop HS 2 CRP 3.7   Review of Systems  Constitutional: Negative for malaise/fatigue and weight gain.  Cardiovascular:  Negative for chest pain, claudication, dyspnea on exertion, leg swelling, near-syncope, orthopnea, palpitations, paroxysmal nocturnal dyspnea and syncope.  Respiratory:  Negative for shortness of breath.   Neurological:  Negative for dizziness.  Psychiatric/Behavioral:  The patient is nervous/anxious.         Vitals:   12/15/20 1052  BP: (!) 144/65  Pulse: (!) 58  Temp: 98.6 F (37 C)  SpO2: 98%      Body mass index is 31.18 kg/m. Filed Weights   12/15/20 1052  Weight: 165 lb (74.8 kg)     Objective:   Physical Exam Vitals and nursing note reviewed.  Constitutional:      General: She is not in acute distress. Neck:     Vascular: No JVD.  Cardiovascular:     Rate and Rhythm: Normal rate and regular rhythm.     Pulses: Intact distal pulses.     Heart sounds: Normal heart sounds, S1 normal and S2 normal. No murmur heard.   No gallop.  Pulmonary:     Effort: Pulmonary effort is normal. No respiratory distress.     Breath sounds: Normal breath sounds. No wheezing, rhonchi or rales.  Musculoskeletal:     Right lower leg: No edema.     Left lower leg: No edema.  Physical exam  unchanged compared to previous office visit.     Assessment & Recommendations:   65 y.o. Caucasian female with hypertension,hypothyroidism, acquired solitary kidney s/p nephrectomy, frequent PVC's  Symptomatic PVCs: Structurally normal heart. Exercise treadmill testing with frequent PVC's, but no ischemia.  PVC burden reduced only slightly, from 20% to 16% (01/2020) on metoprolol tartarate 50 mg bid. Symptomatically much better. Continue metoprolol succinate 100 mg daily, lisinopril 10 mg daily.  Hypertension: Patient's blood pressure is elevated in the office today, and home readings show it is not well controlled. Continue Toprol-XL, amlodipine. Increase lisinopril from 10 mg to 20 mg p.o. daily.  Patient has previously been on 20 mg and tolerated this without issue. Will repeat BMP in 1 week. Patient will continue to monitor blood pressure on a daily basis at home and if it remains >40/90 mmHg could consider increasing amlodipine to 10 mg daily.  Mixed hyperlipidemia: LDL 141. 10 yr ASCVD risk 7.5% Coronary calcium score of 0. Counseled patient regarding risks versus benefits of statin therapy.  Patient remains hesitant to start statin medication.  Patient will therefore work on making  diet and lifestyle modifications and we will repeat lipid profile testing in 3 months to reevaluate lipid management recommendations.  Coronary calcium score findings detailed above.  Advised patient to follow-up with PCP regarding evaluation and management of stable lung nodules.  Follow-up in 3 months, sooner if needed, for hypertension hyperlipidemia.   Alethia Berthold, PA-C 12/15/2020, 12:01 PM Office: [phone]

## 2020-12-22 DIAGNOSIS — I10 Essential (primary) hypertension: Secondary | ICD-10-CM | POA: Diagnosis not present

## 2020-12-23 LAB — BASIC METABOLIC PANEL
BUN/Creatinine Ratio: 18 (ref 12–28)
BUN: 18 mg/dL (ref 8–27)
CO2: 26 mmol/L (ref 20–29)
Calcium: 9.5 mg/dL (ref 8.7–10.3)
Chloride: 104 mmol/L (ref 96–106)
Creatinine, Ser: 0.98 mg/dL (ref 0.57–1.00)
Glucose: 107 mg/dL — ABNORMAL HIGH (ref 70–99)
Potassium: 4.7 mmol/L (ref 3.5–5.2)
Sodium: 143 mmol/L (ref 134–144)
eGFR: 63 mL/min/{1.73_m2} (ref >59)

## 2020-12-27 ENCOUNTER — Other Ambulatory Visit: Payer: Self-pay | Admitting: Cardiology

## 2020-12-29 ENCOUNTER — Telehealth: Payer: Self-pay

## 2020-12-29 MED ORDER — AMLODIPINE BESYLATE 5 MG PO TABS: 5.0000 mg | ORAL_TABLET | Freq: Every day | ORAL | 3 refills | Status: DC

## 2020-12-29 NOTE — Telephone Encounter (Signed)
Patient's blood pressure is overall within acceptable range. She does admit there is room for her to reduce dietary salt intake. She also recently had back pain, which has since resolved but likely contributed to transient increase in blood pressure.   Will not make changes to medications at this time, instead recommend low sodium diet. Patient will continue to monitor blood pressure and notify our office if it remains elevated.    Alethia Berthold, PA-C 12/29/2020, 10:28 AM Office: [phone]

## 2020-12-29 NOTE — Telephone Encounter (Signed)
Increased Lisinopril from 10mg  to 20mg . She is not seeing a real decrease. At. [facility], she saw the decrease, but not she sees it trending back up. She is concerned that this is not where she was told her numbers needed to be and needs a refill on her Amlodipine, but wants to wait to see if you need to make changes before we refill it.  11-14 : 144/65 11-15 : 132/71 11-16 : 120/69 11-17 : 129/73 11-18 : 117/62 11-19 : 130/66 11-20 : 134/70 11-21 : 138/68 11-22 : 128/68 11-23 : 130/72 11-24 : 126/70 11-25 : 135/71 11-26 : 135/72

## 2021-01-29 DIAGNOSIS — R319 Hematuria, unspecified: Secondary | ICD-10-CM | POA: Diagnosis not present

## 2021-01-29 DIAGNOSIS — R309 Painful micturition, unspecified: Secondary | ICD-10-CM | POA: Diagnosis not present

## 2021-01-30 ENCOUNTER — Other Ambulatory Visit: Payer: Self-pay | Admitting: Cardiology

## 2021-01-30 DIAGNOSIS — I493 Ventricular premature depolarization: Secondary | ICD-10-CM

## 2021-02-06 ENCOUNTER — Other Ambulatory Visit: Payer: Self-pay | Admitting: Obstetrics & Gynecology

## 2021-02-06 DIAGNOSIS — Z1231 Encounter for screening mammogram for malignant neoplasm of breast: Secondary | ICD-10-CM

## 2021-03-17 ENCOUNTER — Ambulatory Visit: Payer: Medicare PPO

## 2021-03-17 NOTE — Progress Notes (Signed)
Patient referred by Alroy Dust, L.Marlou Sa, MD for [facility]  Subjective:   Kellie Case, female    DOB: [birth year], 66 y.o.   MRN: [id number]   Chief Complaint  Patient presents with   Hypertension   HLD    3 MONTHS     HPI  66 y.o. Caucasian female with hypertension,hypothyroidism, acquired solitary kidney s/p nephrectomy, symptomatic PVC's.   Patient presents for 68-monthfollow-up of hypertension and hyperlipidemia.  Last office visit increased lisinopril from 10 mg to 20 mg daily, repeat BMP remained stable.  Also last office visit discussed initiation of statin therapy, however patient remained hesitant, therefore she was advised to make diet and lifestyle modifications.  Repeat lipid profile testing has not been done as ordered.   Patient is feeling well overall without specific complaints today.  She brings with her a written log of home blood pressure readings which are under excellent control.  She remains asymptomatic and denies palpitations, dizziness, syncope, near syncope.  Patient is seeing her OB/GYN tomorrow who always does lab work, she request that we send order for lipid profile testing to be done by OB/GYN's office tomorrow.  Current Outpatient Medications on File Prior to Visit  Medication Sig Dispense Refill   amLODipine (NORVASC) 5 MG tablet Take 1 tablet (5 mg total) by mouth daily. 90 tablet 3   CALCIUM PO Take 1 tablet by mouth daily.      Estradiol 10 MCG TABS vaginal tablet Take 1 tablet by mouth 2 (two) times a week.     levothyroxine (SYNTHROID) 75 MCG tablet Take 75 mcg by mouth daily.      lisinopril (ZESTRIL) 20 MG tablet Take 1 tablet (20 mg total) by mouth daily. 30 tablet 3   metoprolol succinate (TOPROL-XL) 100 MG 24 hr tablet TAKE 1 TABLET BY MOUTH ONCE DAILY WITH SUPPER TAKE  WITH  OR  IMMEDIATELY  FOLLOWING  A  MEAL 90 tablet 0   Multiple Vitamin (MULTIVITAMIN) tablet Take 1 tablet by mouth daily.     Polyethyl Glycol-Propyl Glycol (SYSTANE OP)  Place 1 drop into both eyes every morning.     No current facility-administered medications on file prior to visit.    Cardiovascular and other pertinent studies: Coronary calcium scoring 12/04/2020: 1. Coronary calcium score is 0. 2. Stable small pulmonary nodules in the visualized lungs.  EKG 11/19/2020: Sinus bradycardia 47 bpm Low voltage in precordial leads  Mobile cardiac telemetry 13 days 01/04/2020 - 01/18/2020: Dominant rhythm: Sinus. HR 39-148 bpm. Avg HR 67 bpm. 5 episodes of atrial tachycardia, fastest at 148 bpm for 4 beats, longest for 12 beats at 104 bpm. Rare isolated SVE, couplet/triplets. 4 episodes of VT, fastest and longest at 136 bpm for 5 beats 16.3% isolated VE, including bigeminy and trigeminy, rare couplet/triplets. No atrial fibrillation/atrial flutter//high grade AV block, sinus pause >3sec noted.     6 patient triggered events, most correlate with ventricular ectopy.    Mobile cardiac telemetry 7 days 11/14/2019 - 11/21/2019: Dominant rhythm: Sinus. HR 44-144 bpm. Avg HR 81 bpm. 3 episodes of SVT, fastest at 141 bpm for 6 beats. <1% isolated SVE, couplet/triplets. 10 episodes of VT, fastest at 144 bpm for 4 beats, longest for 5 beats at 104 bpm. 20% isolated VE, 1.6% couplets, <1% triplets. No atrial fibrillation/atrial flutter/high grade AV block, sinus pause >3sec noted.  Echocardiogram 11/22/2019:  Left ventricle cavity is normal in size and wall thickness. Normal global  wall motion. Frequent ectopy seen.  Normal LV systolic function with EF  56%. Indeterminate diastolic filling pattern.  No significant valvular abnormality seen.  Normal right atrial pressure.   Exercise treadmill stress test 11/19/2019: Exercise treadmill stress test performed using Bruce protocol.  Patient reached 8.5 METS, and 88% of age predicted maximum heart rate.  Exercise capacity was good.  No chest pain reported.  Normal heart rate and hemodynamic response.  Resting  ECG demonstrated normal sinus rhythm, frequent PVC,s no ST-T wave abnormalities Peak ECG demonstrated sinus tachycardia, no ST-T wave abnormalities. PVC's improved with exercise.  Low risk study.   EKG 11/14/2019: Sinus rhythm 73 bpm Normal EKG  EKG 10/31/2019: Sinus rhythm 66 bpm  Frequent PVCs   Recent labs: CMP Latest Ref Rng & Units 12/22/2020 02/04/2019 11/23/2016  Glucose 70 - 99 mg/dL 107(H) 104(H) 108(H)  BUN 8 - 27 mg/dL 18 16 9   Creatinine 0.57 - 1.00 mg/dL 0.98 0.89 0.57  Sodium 134 - 144 mmol/L 143 140 139  Potassium 3.5 - 5.2 mmol/L 4.7 4.2 4.4  Chloride 96 - 106 mmol/L 104 103 104  CO2 20 - 29 mmol/L 26 27 29   Calcium 8.7 - 10.3 mg/dL 9.5 9.3 8.3(L)  Total Protein 6.5 - 8.1 g/dL - 7.1 -  Total Bilirubin 0.3 - 1.2 mg/dL - 0.6 -  Alkaline Phos 38 - 126 U/L - 71 -  AST 15 - 41 U/L - 21 -  ALT 0 - 44 U/L - 15 -   CBC Latest Ref Rng & Units 02/04/2019 11/29/2016 11/24/2016  WBC 4.0 - 10.5 K/uL 5.3 9.1 11.5(H)  Hemoglobin 12.0 - 15.0 g/dL 13.7 13.0 11.5(L)  Hematocrit 36.0 - 46.0 % 40.9 39.4 34.3(L)  Platelets 150 - 400 K/uL 221 366 225   Lipid Panel  No results found for: CHOL, TRIG, HDL, CHOLHDL, VLDL, LDLCALC, LDLDIRECT HEMOGLOBIN A1C No results found for: HGBA1C, MPG TSH No results for input(s): TSH in the last 8760 hours.  10/08/2020: Glucose 88, BUN/Cr 20/1.0. EGFR 60.  Chol 221, TG 195, HDL 45, LDL 141  03/2020: HbA1C 5.7% TSH 4.1 normal  02/04/2019: Glucose 104, BUN/Cr 16/0.89. EGFR >60. Na/K 140/4.2. Rest of the CMP normal H/H 13/40. MCV 89. Platelets 221 TSH N/A BNP 27, trop HS 2 CRP 3.7   Review of Systems  Cardiovascular:  Negative for chest pain, claudication, dyspnea on exertion, leg swelling, near-syncope, orthopnea, palpitations, paroxysmal nocturnal dyspnea and syncope.  Respiratory:  Negative for shortness of breath.   Psychiatric/Behavioral:  The patient is nervous/anxious.         Vitals:   03/18/21 0929  BP: 128/66  Pulse: (!)  56  Resp: 17  Temp: 98.6 F (37 C)  SpO2: 97%     Body mass index is 31.67 kg/m. Filed Weights   03/18/21 0929  Weight: 167 lb 9.6 oz (76 kg)     Objective:   Physical Exam Vitals reviewed.  Neck:     Vascular: No JVD.  Cardiovascular:     Rate and Rhythm: Normal rate and regular rhythm.     Pulses: Intact distal pulses.     Heart sounds: Normal heart sounds, S1 normal and S2 normal. No murmur heard.   No gallop.  Pulmonary:     Effort: Pulmonary effort is normal. No respiratory distress.     Breath sounds: Normal breath sounds. No wheezing, rhonchi or rales.  Musculoskeletal:     Right lower leg: No edema.     Left lower leg: No edema.  Physical  exam unchanged compared to previous office visit.     Assessment & Recommendations:   66 y.o.Caucasian female with hypertension,hypothyroidism, acquired solitary kidney s/p nephrectomy, frequent PVC's  Hypertension: Patient's blood pressure is now well controlled both on home monitoring and in the office today. Continue Toprol-XL, amlodipine, lisinopril  Mixed hyperlipidemia: LDL 141. 10 yr ASCVD risk 7.5% Coronary calcium score of 0. Patient is now open to initiating statin therapy.  We will therefore obtain repeat lipid profile testing.  Have sent order for lipid panel to patient's OB/GYN office who will be doing lab work tomorrow.  Would recommend initiation of statin therapy if LDL remains uncontrolled.  Symptomatic PVCs: Structurally normal heart. Exercise treadmill testing with frequent PVC's, but no ischemia.  PVC burden reduced only slightly, from 20% to 16% (01/2020) on metoprolol tartarate 50 mg bid. Symptomatically much better. Continue metoprolol succinate 100 mg daily.   Patient is otherwise stable from a cardiovascular standpoint.  Follow-up in 1 year, sooner if needed.   Alethia Berthold, PA-C 03/18/2021, 10:18 AM Office: [phone]

## 2021-03-18 ENCOUNTER — Other Ambulatory Visit: Payer: Self-pay

## 2021-03-18 ENCOUNTER — Ambulatory Visit: Payer: Medicare PPO | Admitting: Student

## 2021-03-18 ENCOUNTER — Encounter: Payer: Self-pay | Admitting: Student

## 2021-03-18 ENCOUNTER — Ambulatory Visit
Admission: RE | Admit: 2021-03-18 | Discharge: 2021-03-18 | Disposition: A | Discharge status: Home / Self Care | Payer: Medicare PPO | Source: Ambulatory Visit | Attending: Obstetrics & Gynecology | Admitting: Obstetrics & Gynecology

## 2021-03-18 VITALS — BP 128/66 | HR 56 | Temp 98.6°F | Resp 17 | Ht 61.0 in | Wt 167.6 lb

## 2021-03-18 DIAGNOSIS — Z1231 Encounter for screening mammogram for malignant neoplasm of breast: Secondary | ICD-10-CM

## 2021-03-18 DIAGNOSIS — E782 Mixed hyperlipidemia: Secondary | ICD-10-CM | POA: Diagnosis not present

## 2021-03-18 DIAGNOSIS — I10 Essential (primary) hypertension: Secondary | ICD-10-CM

## 2021-03-19 DIAGNOSIS — Z0000 Encounter for general adult medical examination without abnormal findings: Secondary | ICD-10-CM | POA: Diagnosis not present

## 2021-03-19 DIAGNOSIS — Z01419 Encounter for gynecological examination (general) (routine) without abnormal findings: Secondary | ICD-10-CM | POA: Diagnosis not present

## 2021-03-19 DIAGNOSIS — Z6830 Body mass index (BMI) 30.0-30.9, adult: Secondary | ICD-10-CM | POA: Diagnosis not present

## 2021-03-24 NOTE — Progress Notes (Signed)
External labs 03/19/2021: TSH 2.89 A1c 5.5% Total cholesterol 239, triglycerides 232, HDL 47, LDL 150 BUN 14, creatinine 1.09, GFR 55, sodium 142, potassium 5.2, Hgb 14, HCT 42.4, MCV 90, platelet 285

## 2021-03-26 ENCOUNTER — Other Ambulatory Visit: Payer: Self-pay | Admitting: Student

## 2021-03-26 MED ORDER — ATORVASTATIN CALCIUM 20 MG PO TABS: 20.0000 mg | ORAL_TABLET | Freq: Every day | ORAL | 11 refills | Status: DC

## 2021-03-26 NOTE — Progress Notes (Signed)
Called and spoke to pt. Pts OBGYN prescribed Rosuvastatin 10 mg a few days ago. She wants to know which you would prefer. Please advise.

## 2021-03-27 NOTE — Progress Notes (Signed)
Tried calling patient no answer left a vm

## 2021-03-27 NOTE — Progress Notes (Signed)
Rosuvastatin is a great choice, please continue.

## 2021-03-31 NOTE — Progress Notes (Signed)
Called pt, no answer. Left vm requesting call back?

## 2021-04-01 NOTE — Progress Notes (Signed)
Pt aware.

## 2021-04-20 DIAGNOSIS — N3020 Other chronic cystitis without hematuria: Secondary | ICD-10-CM | POA: Diagnosis not present

## 2021-04-20 DIAGNOSIS — Z85528 Personal history of other malignant neoplasm of kidney: Secondary | ICD-10-CM | POA: Diagnosis not present

## 2021-04-20 DIAGNOSIS — N393 Stress incontinence (female) (male): Secondary | ICD-10-CM | POA: Diagnosis not present

## 2021-04-24 ENCOUNTER — Other Ambulatory Visit: Payer: Self-pay | Admitting: Student

## 2021-04-24 DIAGNOSIS — I493 Ventricular premature depolarization: Secondary | ICD-10-CM

## 2021-05-06 ENCOUNTER — Other Ambulatory Visit: Payer: Self-pay | Admitting: Student

## 2021-05-06 DIAGNOSIS — E7800 Pure hypercholesterolemia, unspecified: Secondary | ICD-10-CM | POA: Diagnosis not present

## 2021-05-14 NOTE — Progress Notes (Signed)
External labs 05/06/2021: ?BUN 16, creatinine 0.97, GFR >60, sodium 140, potassium 4.5 ?Total cholesterol 133, triglycerides 75, HDL 51, LDL 67

## 2021-06-01 DIAGNOSIS — Z08 Encounter for follow-up examination after completed treatment for malignant neoplasm: Secondary | ICD-10-CM | POA: Diagnosis not present

## 2021-06-01 DIAGNOSIS — Z85528 Personal history of other malignant neoplasm of kidney: Secondary | ICD-10-CM | POA: Diagnosis not present

## 2021-06-01 DIAGNOSIS — Z905 Acquired absence of kidney: Secondary | ICD-10-CM | POA: Diagnosis not present

## 2021-06-01 DIAGNOSIS — C642 Malignant neoplasm of left kidney, except renal pelvis: Secondary | ICD-10-CM | POA: Diagnosis not present

## 2021-06-10 ENCOUNTER — Other Ambulatory Visit: Payer: Self-pay | Admitting: Student

## 2021-06-25 DIAGNOSIS — L309 Dermatitis, unspecified: Secondary | ICD-10-CM | POA: Diagnosis not present

## 2021-07-09 DIAGNOSIS — L814 Other melanin hyperpigmentation: Secondary | ICD-10-CM | POA: Diagnosis not present

## 2021-07-09 DIAGNOSIS — L603 Nail dystrophy: Secondary | ICD-10-CM | POA: Diagnosis not present

## 2021-07-09 DIAGNOSIS — D2239 Melanocytic nevi of other parts of face: Secondary | ICD-10-CM | POA: Diagnosis not present

## 2021-07-09 DIAGNOSIS — D2272 Melanocytic nevi of left lower limb, including hip: Secondary | ICD-10-CM | POA: Diagnosis not present

## 2021-07-09 DIAGNOSIS — D225 Melanocytic nevi of trunk: Secondary | ICD-10-CM | POA: Diagnosis not present

## 2021-07-09 DIAGNOSIS — D2262 Melanocytic nevi of left upper limb, including shoulder: Secondary | ICD-10-CM | POA: Diagnosis not present

## 2021-07-09 DIAGNOSIS — L821 Other seborrheic keratosis: Secondary | ICD-10-CM | POA: Diagnosis not present

## 2021-07-09 DIAGNOSIS — L570 Actinic keratosis: Secondary | ICD-10-CM | POA: Diagnosis not present

## 2021-07-13 DIAGNOSIS — H40033 Anatomical narrow angle, bilateral: Secondary | ICD-10-CM | POA: Diagnosis not present

## 2021-07-13 DIAGNOSIS — H25813 Combined forms of age-related cataract, bilateral: Secondary | ICD-10-CM | POA: Diagnosis not present

## 2021-07-13 DIAGNOSIS — D23112 Other benign neoplasm of skin of right lower eyelid, including canthus: Secondary | ICD-10-CM | POA: Diagnosis not present

## 2021-07-13 DIAGNOSIS — H524 Presbyopia: Secondary | ICD-10-CM | POA: Diagnosis not present

## 2021-07-13 DIAGNOSIS — H04123 Dry eye syndrome of bilateral lacrimal glands: Secondary | ICD-10-CM | POA: Diagnosis not present

## 2021-07-15 ENCOUNTER — Other Ambulatory Visit: Payer: Self-pay | Admitting: Student

## 2021-07-19 ENCOUNTER — Other Ambulatory Visit: Payer: Self-pay | Admitting: Student

## 2021-07-19 DIAGNOSIS — I493 Ventricular premature depolarization: Secondary | ICD-10-CM

## 2021-07-30 DIAGNOSIS — H109 Unspecified conjunctivitis: Secondary | ICD-10-CM | POA: Diagnosis not present

## 2021-07-30 DIAGNOSIS — Z23 Encounter for immunization: Secondary | ICD-10-CM | POA: Diagnosis not present

## 2021-08-11 ENCOUNTER — Other Ambulatory Visit: Payer: Self-pay | Admitting: Student

## 2021-08-12 ENCOUNTER — Other Ambulatory Visit: Payer: Self-pay

## 2021-08-12 MED ORDER — LISINOPRIL 20 MG PO TABS
20.0000 mg | ORAL_TABLET | Freq: Every day | ORAL | 0 refills | Status: DC
Start: 2021-08-12 — End: 2021-09-10

## 2021-09-10 ENCOUNTER — Other Ambulatory Visit: Payer: Self-pay | Admitting: Cardiology

## 2021-09-10 MED ORDER — LISINOPRIL 20 MG PO TABS: 20.0000 mg | ORAL_TABLET | Freq: Every day | ORAL | 3 refills | Status: DC

## 2021-10-15 ENCOUNTER — Other Ambulatory Visit: Payer: Self-pay

## 2021-10-15 DIAGNOSIS — I493 Ventricular premature depolarization: Secondary | ICD-10-CM

## 2021-10-15 MED ORDER — METOPROLOL SUCCINATE ER 100 MG PO TB24: ORAL_TABLET | ORAL | 2 refills | Status: DC

## 2021-11-04 ENCOUNTER — Other Ambulatory Visit: Payer: Self-pay | Admitting: Internal Medicine

## 2021-11-04 DIAGNOSIS — Z905 Acquired absence of kidney: Secondary | ICD-10-CM | POA: Diagnosis not present

## 2021-11-04 DIAGNOSIS — I493 Ventricular premature depolarization: Secondary | ICD-10-CM | POA: Diagnosis not present

## 2021-11-04 DIAGNOSIS — E7800 Pure hypercholesterolemia, unspecified: Secondary | ICD-10-CM | POA: Diagnosis not present

## 2021-11-04 DIAGNOSIS — N1831 Chronic kidney disease, stage 3a: Secondary | ICD-10-CM | POA: Diagnosis not present

## 2021-11-04 DIAGNOSIS — N951 Menopausal and female climacteric states: Secondary | ICD-10-CM | POA: Diagnosis not present

## 2021-11-04 DIAGNOSIS — R911 Solitary pulmonary nodule: Secondary | ICD-10-CM | POA: Diagnosis not present

## 2021-11-04 DIAGNOSIS — Z0000 Encounter for general adult medical examination without abnormal findings: Secondary | ICD-10-CM | POA: Diagnosis not present

## 2021-11-04 DIAGNOSIS — Z85528 Personal history of other malignant neoplasm of kidney: Secondary | ICD-10-CM | POA: Diagnosis not present

## 2021-11-04 DIAGNOSIS — E039 Hypothyroidism, unspecified: Secondary | ICD-10-CM | POA: Diagnosis not present

## 2021-11-04 DIAGNOSIS — I10 Essential (primary) hypertension: Secondary | ICD-10-CM | POA: Diagnosis not present

## 2021-11-04 DIAGNOSIS — E2839 Other primary ovarian failure: Secondary | ICD-10-CM

## 2021-11-17 DIAGNOSIS — M5451 Vertebrogenic low back pain: Secondary | ICD-10-CM | POA: Diagnosis not present

## 2021-11-17 DIAGNOSIS — M25511 Pain in right shoulder: Secondary | ICD-10-CM | POA: Diagnosis not present

## 2021-12-28 DIAGNOSIS — B353 Tinea pedis: Secondary | ICD-10-CM | POA: Diagnosis not present

## 2021-12-28 DIAGNOSIS — D485 Neoplasm of uncertain behavior of skin: Secondary | ICD-10-CM | POA: Diagnosis not present

## 2021-12-28 DIAGNOSIS — L400 Psoriasis vulgaris: Secondary | ICD-10-CM | POA: Diagnosis not present

## 2021-12-28 DIAGNOSIS — C44529 Squamous cell carcinoma of skin of other part of trunk: Secondary | ICD-10-CM | POA: Diagnosis not present

## 2022-01-08 DIAGNOSIS — N3020 Other chronic cystitis without hematuria: Secondary | ICD-10-CM | POA: Diagnosis not present

## 2022-02-03 DIAGNOSIS — H43811 Vitreous degeneration, right eye: Secondary | ICD-10-CM | POA: Diagnosis not present

## 2022-02-03 DIAGNOSIS — H43391 Other vitreous opacities, right eye: Secondary | ICD-10-CM | POA: Diagnosis not present

## 2022-03-05 DIAGNOSIS — H43811 Vitreous degeneration, right eye: Secondary | ICD-10-CM | POA: Diagnosis not present

## 2022-03-17 NOTE — Progress Notes (Unsigned)
Patient referred by Alroy Dust, L.Marlou Sa, MD for [facility]  Subjective:   Kellie Case, female    DOB: [birth year], 67 y.o.   MRN: [id number]   No chief complaint on file.    HPI  67 y.o. Caucasian female with hypertension,hypothyroidism, acquired solitary kidney s/p nephrectomy, symptomatic PVC's  Patient has not had any palpitation symptoms.  Blood pressure elevated today, but historically not normal at home.  Patient is very concerned about her blood pressure given her solitary kidney.   Current Outpatient Medications:    amLODipine (NORVASC) 5 MG tablet, Take 1 tablet (5 mg total) by mouth daily., Disp: 90 tablet, Rfl: 3   atorvastatin (LIPITOR) 20 MG tablet, Take 1 tablet (20 mg total) by mouth daily., Disp: 30 tablet, Rfl: 11   CALCIUM PO, Take 1 tablet by mouth daily. , Disp: , Rfl:    Estradiol 10 MCG TABS vaginal tablet, Take 1 tablet by mouth 2 (two) times a week., Disp: , Rfl:    levothyroxine (SYNTHROID) 75 MCG tablet, Take 75 mcg by mouth daily. , Disp: , Rfl:    lisinopril (ZESTRIL) 20 MG tablet, Take 1 tablet (20 mg total) by mouth daily., Disp: 90 tablet, Rfl: 3   metoprolol succinate (TOPROL-XL) 100 MG 24 hr tablet, TAKE 1 TABLET BY MOUTH ONCE DAILY WITH  SUPPER  OR  IMMEDIATELY  FOLLOWING  A  MEAL, Disp: 90 tablet, Rfl: 2   Multiple Vitamin (MULTIVITAMIN) tablet, Take 1 tablet by mouth daily., Disp: , Rfl:    Polyethyl Glycol-Propyl Glycol (SYSTANE OP), Place 1 drop into both eyes every morning., Disp: , Rfl:   Cardiovascular and other pertinent studies:  CT cardiac scoring 12/04/2020: 1. Coronary calcium score is 0. 2. Stable small pulmonary nodules in the visualized lungs.   EKG 11/19/2020: Sinus bradycardia 47 bpm Low voltage in precordial leads  Mobile cardiac telemetry 13 days 01/04/2020 - 01/18/2020: Dominant rhythm: Sinus. HR 39-148 bpm. Avg HR 67 bpm. 5 episodes of atrial tachycardia, fastest at 148 bpm for 4 beats, longest for 12 beats at 104 bpm. Rare  isolated SVE, couplet/triplets. 4 episodes of VT, fastest and longest at 136 bpm for 5 beats 16.3% isolated VE, including bigeminy and trigeminy, rare couplet/triplets. No atrial fibrillation/atrial flutter//high grade AV block, sinus pause >3sec noted.     6 patient triggered events, most correlate with ventricular ectopy.    Mobile cardiac telemetry 7 days 11/14/2019 - 11/21/2019: Dominant rhythm: Sinus. HR 44-144 bpm. Avg HR 81 bpm. 3 episodes of SVT, fastest at 141 bpm for 6 beats. <1% isolated SVE, couplet/triplets. 10 episodes of VT, fastest at 144 bpm for 4 beats, longest for 5 beats at 104 bpm. 20% isolated VE, 1.6% couplets, <1% triplets. No atrial fibrillation/atrial flutter/high grade AV block, sinus pause >3sec noted.   Echocardiogram 11/22/2019:  Left ventricle cavity is normal in size and wall thickness. Normal global  wall motion. Frequent ectopy seen.  Normal LV systolic function with EF  56%. Indeterminate diastolic filling pattern.  No significant valvular abnormality seen.  Normal right atrial pressure.   Exercise treadmill stress test 11/19/2019: Exercise treadmill stress test performed using Bruce protocol.  Patient reached 8.5 METS, and 88% of age predicted maximum heart rate.  Exercise capacity was good.  No chest pain reported.  Normal heart rate and hemodynamic response.  Resting ECG demonstrated normal sinus rhythm, frequent PVC,s no ST-T wave abnormalities Peak ECG demonstrated sinus tachycardia, no ST-T wave abnormalities. PVC's improved with exercise.  Low  risk study.   EKG 11/14/2019: Sinus rhythm 73 bpm Normal EKG  EKG 10/31/2019: Sinus rhythm 66 bpm  Frequent PVCs   Recent labs: 10/08/2020: Glucose 88, BUN/Cr 20/1.0. EGFR 60.  Chol 221, TG 195, HDL 45, LDL 141  03/2020: HbA1C 5.7% TSH 4.1 normal  02/04/2019: Glucose 104, BUN/Cr 16/0.89. EGFR >60. Na/K 140/4.2. Rest of the CMP normal H/H 13/40. MCV 89. Platelets 221 TSH N/A BNP 27, trop HS  2 CRP 3.7   Review of Systems  Cardiovascular:  Negative for chest pain, dyspnea on exertion, leg swelling, palpitations and syncope.  Psychiatric/Behavioral:  The patient is nervous/anxious.          There were no vitals filed for this visit.    There is no height or weight on file to calculate BMI. There were no vitals filed for this visit.    Objective:   Physical Exam Vitals and nursing note reviewed.  Constitutional:      General: She is not in acute distress. Neck:     Vascular: No JVD.  Cardiovascular:     Rate and Rhythm: Normal rate and regular rhythm.     Heart sounds: Normal heart sounds. No murmur heard. Pulmonary:     Effort: Pulmonary effort is normal.     Breath sounds: Normal breath sounds. No wheezing or rales.  Musculoskeletal:     Right lower leg: No edema.     Left lower leg: No edema.          Assessment & Recommendations:   67 y.o. Caucasian female with hypertension,hypothyroidism, acquired solitary kidney s/p nephrectomy, frequent PVC's  *** Symptomatic PVCs: Structurally normal heart. Exercise treadmill testing with frequent PVC's, but no ischemia.  PVC burden reduced only slightly, from 20% to 16% (01/2020) on metoprolol tartarate 50 mg bid. Symptomatically much better. Rest EKG today with HR in 40s without a single PVC Continue metoprolol succinate 100 mg daily, lisinopril 10 mg daily.  *** Hypertension: Generally normal at home, suspect whitecoat hypertension.  Patient would like to follow-up visit with comparison of home blood pressure monitor and office monitor.  If blood pressure consistently >140/90 mmHg, could cautiously increase lisinopril to 20 mg daily and check BMP in 1 week.  Alternatively, could keep lisinopril at 10 mg daily, and add amlodipine 5 mg daily.  *** Mixed hyperlipidemia: LDL 141. 10 yr ASCVD risk 7.5% Patient is not sure about starting statin, was previously on red yeast extract. Will obtain CT cardiac  scoring for certification.  If calcium score elevated, recommend statin.  F/u in ***    [name], MD Pager: [phone] Office: [phone]

## 2022-03-18 ENCOUNTER — Ambulatory Visit: Payer: Medicare PPO | Admitting: Student

## 2022-03-18 ENCOUNTER — Ambulatory Visit: Payer: Medicare PPO | Admitting: Cardiology

## 2022-03-18 ENCOUNTER — Encounter: Payer: Self-pay | Admitting: Cardiology

## 2022-03-18 VITALS — BP 131/59 | HR 64 | Resp 16 | Ht 61.0 in | Wt 181.0 lb

## 2022-03-18 DIAGNOSIS — I493 Ventricular premature depolarization: Secondary | ICD-10-CM | POA: Diagnosis not present

## 2022-03-18 DIAGNOSIS — E782 Mixed hyperlipidemia: Secondary | ICD-10-CM

## 2022-03-18 DIAGNOSIS — I10 Essential (primary) hypertension: Secondary | ICD-10-CM

## 2022-03-18 MED ORDER — METOPROLOL SUCCINATE ER 100 MG PO TB24: ORAL_TABLET | ORAL | 3 refills | Status: DC

## 2022-03-18 MED ORDER — LISINOPRIL 20 MG PO TABS: 20.0000 mg | ORAL_TABLET | Freq: Every day | ORAL | 3 refills | Status: DC

## 2022-03-18 MED ORDER — AMLODIPINE BESYLATE 5 MG PO TABS: 5.0000 mg | ORAL_TABLET | Freq: Every day | ORAL | 3 refills | Status: DC

## 2022-03-18 MED ORDER — ROSUVASTATIN CALCIUM 10 MG PO TABS: 10.0000 mg | ORAL_TABLET | Freq: Every day | ORAL | 3 refills | Status: DC

## 2022-04-08 DIAGNOSIS — Z01419 Encounter for gynecological examination (general) (routine) without abnormal findings: Secondary | ICD-10-CM | POA: Diagnosis not present

## 2022-04-08 DIAGNOSIS — Z0000 Encounter for general adult medical examination without abnormal findings: Secondary | ICD-10-CM | POA: Diagnosis not present

## 2022-04-08 DIAGNOSIS — Z1231 Encounter for screening mammogram for malignant neoplasm of breast: Secondary | ICD-10-CM | POA: Diagnosis not present

## 2022-04-08 DIAGNOSIS — Z6832 Body mass index (BMI) 32.0-32.9, adult: Secondary | ICD-10-CM | POA: Diagnosis not present

## 2022-04-08 DIAGNOSIS — E7800 Pure hypercholesterolemia, unspecified: Secondary | ICD-10-CM | POA: Diagnosis not present

## 2022-04-08 DIAGNOSIS — E039 Hypothyroidism, unspecified: Secondary | ICD-10-CM | POA: Diagnosis not present

## 2022-04-29 ENCOUNTER — Ambulatory Visit
Admission: RE | Admit: 2022-04-29 | Discharge: 2022-04-29 | Disposition: A | Discharge status: Home / Self Care | Payer: Medicare PPO | Source: Ambulatory Visit | Attending: Internal Medicine | Admitting: Internal Medicine

## 2022-04-29 DIAGNOSIS — M8589 Other specified disorders of bone density and structure, multiple sites: Secondary | ICD-10-CM | POA: Diagnosis not present

## 2022-04-29 DIAGNOSIS — E2839 Other primary ovarian failure: Secondary | ICD-10-CM

## 2022-04-29 DIAGNOSIS — Z780 Asymptomatic menopausal state: Secondary | ICD-10-CM | POA: Diagnosis not present

## 2022-05-11 DIAGNOSIS — L910 Hypertrophic scar: Secondary | ICD-10-CM | POA: Diagnosis not present

## 2022-06-08 DIAGNOSIS — Z85528 Personal history of other malignant neoplasm of kidney: Secondary | ICD-10-CM | POA: Diagnosis not present

## 2022-06-08 DIAGNOSIS — Z08 Encounter for follow-up examination after completed treatment for malignant neoplasm: Secondary | ICD-10-CM | POA: Diagnosis not present

## 2022-06-08 DIAGNOSIS — C642 Malignant neoplasm of left kidney, except renal pelvis: Secondary | ICD-10-CM | POA: Diagnosis not present

## 2022-06-23 DIAGNOSIS — L821 Other seborrheic keratosis: Secondary | ICD-10-CM | POA: Diagnosis not present

## 2022-06-23 DIAGNOSIS — Z85828 Personal history of other malignant neoplasm of skin: Secondary | ICD-10-CM | POA: Diagnosis not present

## 2022-06-23 DIAGNOSIS — I8391 Asymptomatic varicose veins of right lower extremity: Secondary | ICD-10-CM | POA: Diagnosis not present

## 2022-06-23 DIAGNOSIS — L814 Other melanin hyperpigmentation: Secondary | ICD-10-CM | POA: Diagnosis not present

## 2022-07-07 ENCOUNTER — Other Ambulatory Visit: Payer: Self-pay

## 2022-07-07 DIAGNOSIS — I493 Ventricular premature depolarization: Secondary | ICD-10-CM

## 2022-07-07 MED ORDER — METOPROLOL SUCCINATE ER 100 MG PO TB24: ORAL_TABLET | ORAL | 3 refills | Status: DC

## 2022-07-20 DIAGNOSIS — M25562 Pain in left knee: Secondary | ICD-10-CM | POA: Diagnosis not present

## 2022-07-20 DIAGNOSIS — N1831 Chronic kidney disease, stage 3a: Secondary | ICD-10-CM | POA: Diagnosis not present

## 2022-07-20 DIAGNOSIS — R1032 Left lower quadrant pain: Secondary | ICD-10-CM | POA: Diagnosis not present

## 2022-07-20 DIAGNOSIS — M25561 Pain in right knee: Secondary | ICD-10-CM | POA: Diagnosis not present

## 2022-07-20 DIAGNOSIS — E7800 Pure hypercholesterolemia, unspecified: Secondary | ICD-10-CM | POA: Diagnosis not present

## 2022-07-20 DIAGNOSIS — M25511 Pain in right shoulder: Secondary | ICD-10-CM | POA: Diagnosis not present

## 2022-07-22 DIAGNOSIS — N200 Calculus of kidney: Secondary | ICD-10-CM | POA: Diagnosis not present

## 2022-07-22 DIAGNOSIS — N3020 Other chronic cystitis without hematuria: Secondary | ICD-10-CM | POA: Diagnosis not present

## 2022-07-22 DIAGNOSIS — N393 Stress incontinence (female) (male): Secondary | ICD-10-CM | POA: Diagnosis not present

## 2022-07-28 ENCOUNTER — Encounter: Payer: Self-pay | Admitting: Obstetrics and Gynecology

## 2022-07-30 ENCOUNTER — Ambulatory Visit
Admission: RE | Admit: 2022-07-30 | Discharge: 2022-07-30 | Disposition: A | Discharge status: Home / Self Care | Payer: Medicare PPO | Source: Ambulatory Visit | Attending: Internal Medicine | Admitting: Internal Medicine

## 2022-07-30 ENCOUNTER — Other Ambulatory Visit: Payer: Self-pay | Admitting: Internal Medicine

## 2022-07-30 DIAGNOSIS — R1032 Left lower quadrant pain: Secondary | ICD-10-CM

## 2022-07-30 DIAGNOSIS — M25552 Pain in left hip: Secondary | ICD-10-CM | POA: Diagnosis not present

## 2022-08-24 DIAGNOSIS — M25561 Pain in right knee: Secondary | ICD-10-CM | POA: Diagnosis not present

## 2022-08-31 DIAGNOSIS — M25561 Pain in right knee: Secondary | ICD-10-CM | POA: Diagnosis not present

## 2022-09-13 DIAGNOSIS — M25561 Pain in right knee: Secondary | ICD-10-CM | POA: Diagnosis not present

## 2022-09-20 DIAGNOSIS — M25561 Pain in right knee: Secondary | ICD-10-CM | POA: Diagnosis not present

## 2022-09-22 ENCOUNTER — Other Ambulatory Visit: Payer: Self-pay

## 2022-09-22 MED ORDER — LISINOPRIL 20 MG PO TABS: 20.0000 mg | ORAL_TABLET | Freq: Every day | ORAL | 3 refills | Status: DC

## 2022-10-06 DIAGNOSIS — H40033 Anatomical narrow angle, bilateral: Secondary | ICD-10-CM | POA: Diagnosis not present

## 2022-10-06 DIAGNOSIS — H43392 Other vitreous opacities, left eye: Secondary | ICD-10-CM | POA: Diagnosis not present

## 2022-10-06 DIAGNOSIS — H524 Presbyopia: Secondary | ICD-10-CM | POA: Diagnosis not present

## 2022-10-06 DIAGNOSIS — H25813 Combined forms of age-related cataract, bilateral: Secondary | ICD-10-CM | POA: Diagnosis not present

## 2022-10-06 DIAGNOSIS — H04123 Dry eye syndrome of bilateral lacrimal glands: Secondary | ICD-10-CM | POA: Diagnosis not present

## 2022-10-07 DIAGNOSIS — M25561 Pain in right knee: Secondary | ICD-10-CM | POA: Diagnosis not present

## 2022-10-15 DIAGNOSIS — M25561 Pain in right knee: Secondary | ICD-10-CM | POA: Diagnosis not present

## 2022-10-26 DIAGNOSIS — M25561 Pain in right knee: Secondary | ICD-10-CM | POA: Diagnosis not present

## 2022-11-01 DIAGNOSIS — N3946 Mixed incontinence: Secondary | ICD-10-CM | POA: Diagnosis not present

## 2022-11-01 DIAGNOSIS — M6281 Muscle weakness (generalized): Secondary | ICD-10-CM | POA: Diagnosis not present

## 2022-12-15 DIAGNOSIS — E7800 Pure hypercholesterolemia, unspecified: Secondary | ICD-10-CM | POA: Diagnosis not present

## 2022-12-15 DIAGNOSIS — R7303 Prediabetes: Secondary | ICD-10-CM | POA: Diagnosis not present

## 2022-12-15 DIAGNOSIS — I10 Essential (primary) hypertension: Secondary | ICD-10-CM | POA: Diagnosis not present

## 2022-12-15 DIAGNOSIS — I493 Ventricular premature depolarization: Secondary | ICD-10-CM | POA: Diagnosis not present

## 2022-12-15 DIAGNOSIS — Z85528 Personal history of other malignant neoplasm of kidney: Secondary | ICD-10-CM | POA: Diagnosis not present

## 2022-12-15 DIAGNOSIS — Z0000 Encounter for general adult medical examination without abnormal findings: Secondary | ICD-10-CM | POA: Diagnosis not present

## 2022-12-15 DIAGNOSIS — Z1159 Encounter for screening for other viral diseases: Secondary | ICD-10-CM | POA: Diagnosis not present

## 2022-12-15 DIAGNOSIS — I708 Atherosclerosis of other arteries: Secondary | ICD-10-CM | POA: Diagnosis not present

## 2022-12-15 DIAGNOSIS — R911 Solitary pulmonary nodule: Secondary | ICD-10-CM | POA: Diagnosis not present

## 2022-12-15 DIAGNOSIS — E039 Hypothyroidism, unspecified: Secondary | ICD-10-CM | POA: Diagnosis not present

## 2022-12-15 DIAGNOSIS — N1831 Chronic kidney disease, stage 3a: Secondary | ICD-10-CM | POA: Diagnosis not present

## 2022-12-28 DIAGNOSIS — M6289 Other specified disorders of muscle: Secondary | ICD-10-CM | POA: Diagnosis not present

## 2022-12-28 DIAGNOSIS — M6281 Muscle weakness (generalized): Secondary | ICD-10-CM | POA: Diagnosis not present

## 2022-12-28 DIAGNOSIS — N3946 Mixed incontinence: Secondary | ICD-10-CM | POA: Diagnosis not present

## 2022-12-28 DIAGNOSIS — R152 Fecal urgency: Secondary | ICD-10-CM | POA: Diagnosis not present

## 2022-12-28 DIAGNOSIS — M62838 Other muscle spasm: Secondary | ICD-10-CM | POA: Diagnosis not present

## 2023-03-10 ENCOUNTER — Other Ambulatory Visit: Payer: Self-pay | Admitting: Cardiology

## 2023-03-17 ENCOUNTER — Ambulatory Visit: Payer: Self-pay | Admitting: Cardiology

## 2023-03-22 ENCOUNTER — Ambulatory Visit: Payer: Medicare PPO | Attending: Cardiology | Admitting: Cardiology

## 2023-03-22 VITALS — BP 110/78 | HR 61 | Ht 61.0 in | Wt 185.8 lb

## 2023-03-22 DIAGNOSIS — I10 Essential (primary) hypertension: Secondary | ICD-10-CM | POA: Diagnosis not present

## 2023-03-22 DIAGNOSIS — E782 Mixed hyperlipidemia: Secondary | ICD-10-CM

## 2023-03-22 DIAGNOSIS — I493 Ventricular premature depolarization: Secondary | ICD-10-CM | POA: Diagnosis not present

## 2023-03-22 NOTE — Progress Notes (Unsigned)
  Cardiology Office Note:  .   Date:  03/22/2023  ID:  Kellie Case, DOB September 13, 1955, MRN [id number] PCP: Asencion Gowda.August Saucer, MD (Inactive)  [facility] Providers Cardiologist:  Truett Mainland, MD PCP: Kellie Case, Kellie Case.August Saucer, MD (Inactive)  No chief complaint on file.     History of Present Illness: .    Kellie Case is a 68 y.o. female with hypertension,hypothyroidism, acquired solitary kidney s/p nephrectomy, frequent PVC's   *** Symptomatic PVCs: Structurally normal heart. Symptoms well controlled on metoprolol succinate 100 mg daily, lisinopril 10 mg daily.   *** Hypertension: Well controlled on metoprolol succinate 100 mg daily, lisinopril 10 mg daily, amlodipine 5 mg daily, lisinopril 20 mg daily.    *** Mixed hyperlipidemia: Chol 135, TG 216, HDL 45, LDL 55 (11/2021) on Lipitor 20 mg daily  ***  Vitals:   03/22/23 1551  BP: 110/78  Pulse: 61  SpO2: 94%     ROS: *** ROS   Studies Reviewed: Marland Kitchen        EKG 03/22/2023 Normal sinus rhythm Low voltage QRS When compared with ECG of 04-Feb-2019 13:13, No significant change was found    Independently interpreted 12/2022: Chol 147, TG 190, HDL 42, LDL 72 HbA1C 5.7% Hb 14.1 Cr 1.0   Risk Assessment/Calculations:   {Does this patient have ATRIAL FIBRILLATION?:[phone]}     Physical Exam:   Physical Exam   VISIT DIAGNOSES: No diagnosis found.   ASSESSMENT AND PLAN: .    Kellie Case is a 68 y.o. female with ***  {Are you ordering a CV Procedure (e.g. stress test, cath, DCCV, TEE, etc)?   Press F2        :[id number]}    No orders of the defined types were placed in this encounter.    F/u in ***  Signed, Elder Negus, MD

## 2023-03-22 NOTE — Patient Instructions (Signed)
Medication Instructions:   Your physician recommends that you continue on your current medications as directed. Please refer to the Current Medication list given to you today.  *If you need a refill on your cardiac medications before your next appointment, please call your pharmacy*     Follow-Up: At [facility], you and your health needs are our priority.  As part of our continuing mission to provide you with exceptional heart care, we have created designated Provider Care Teams.  These Care Teams include your primary Cardiologist (physician) and Advanced Practice Providers (APPs -  Physician Assistants and Nurse Practitioners) who all work together to provide you with the care you need, when you need it.  We recommend signing up for the patient portal called "MyChart".  Sign up information is provided on this After Visit Summary.  MyChart is used to connect with patients for Virtual Visits (Telemedicine).  Patients are able to view lab/test results, encounter notes, upcoming appointments, etc.  Non-urgent messages can be sent to your provider as well.   To learn more about what you can do with MyChart, go to [email or web address].    Your next appointment:   1 year(s)  Provider:   DR. Rosemary Holms   Other Instructions    1st Floor: - Lobby - Registration  - Pharmacy  - Lab - Cafe  2nd Floor: - PV Lab - Diagnostic Testing (echo, CT, nuclear med)  3rd Floor: - Vacant  4th Floor: - TCTS (cardiothoracic surgery) - AFib Clinic - Structural Heart Clinic - Vascular Surgery  - Vascular Ultrasound  5th Floor: - HeartCare Cardiology (general and EP) - Clinical Pharmacy for coumadin, hypertension, lipid, weight-loss medications, and med management appointments    Valet parking services will be available as well.

## 2023-03-23 ENCOUNTER — Encounter: Payer: Self-pay | Admitting: Cardiology

## 2023-03-31 DIAGNOSIS — M6289 Other specified disorders of muscle: Secondary | ICD-10-CM | POA: Diagnosis not present

## 2023-03-31 DIAGNOSIS — M6281 Muscle weakness (generalized): Secondary | ICD-10-CM | POA: Diagnosis not present

## 2023-03-31 DIAGNOSIS — N3946 Mixed incontinence: Secondary | ICD-10-CM | POA: Diagnosis not present

## 2023-03-31 DIAGNOSIS — R152 Fecal urgency: Secondary | ICD-10-CM | POA: Diagnosis not present

## 2023-03-31 DIAGNOSIS — M62838 Other muscle spasm: Secondary | ICD-10-CM | POA: Diagnosis not present

## 2023-04-11 DIAGNOSIS — Z01419 Encounter for gynecological examination (general) (routine) without abnormal findings: Secondary | ICD-10-CM | POA: Diagnosis not present

## 2023-04-11 DIAGNOSIS — Z1231 Encounter for screening mammogram for malignant neoplasm of breast: Secondary | ICD-10-CM | POA: Diagnosis not present

## 2023-04-29 DIAGNOSIS — I10 Essential (primary) hypertension: Secondary | ICD-10-CM | POA: Diagnosis not present

## 2023-04-29 DIAGNOSIS — M79672 Pain in left foot: Secondary | ICD-10-CM | POA: Diagnosis not present

## 2023-04-29 DIAGNOSIS — Z6834 Body mass index (BMI) 34.0-34.9, adult: Secondary | ICD-10-CM | POA: Diagnosis not present

## 2023-05-09 ENCOUNTER — Ambulatory Visit (INDEPENDENT_AMBULATORY_CARE_PROVIDER_SITE_OTHER)

## 2023-05-09 ENCOUNTER — Encounter: Payer: Self-pay | Admitting: Podiatry

## 2023-05-09 ENCOUNTER — Ambulatory Visit: Admitting: Podiatry

## 2023-05-09 DIAGNOSIS — M7752 Other enthesopathy of left foot: Secondary | ICD-10-CM

## 2023-05-09 DIAGNOSIS — M778 Other enthesopathies, not elsewhere classified: Secondary | ICD-10-CM

## 2023-05-09 DIAGNOSIS — M19079 Primary osteoarthritis, unspecified ankle and foot: Secondary | ICD-10-CM | POA: Diagnosis not present

## 2023-05-09 NOTE — Progress Notes (Unsigned)
 Only one kidney    Chief Complaint  Patient presents with   Foot Pain    np Left foot and ankle pain, right foot pain as well Pain in both aches especially with extra walking or working out Whole foot is really hurting pain in dorsal foot as well    HPI: 68 y.o. female presenting today as a new patient for evaluation of pain and tenderness generalized throughout the bilateral feet.  History of prior bunion surgery  Past Medical History:  Diagnosis Date   History of kidney stones    2012   Hyperlipidemia 2018   TG - 180, LDL - 132   Hypothyroidism    Thyroid disease    Urinary incontinence     Past Surgical History:  Procedure Laterality Date   ANTERIOR AND POSTERIOR REPAIR N/A 11/22/2016   Procedure: POSTERIOR REPAIR (RECTOCELE);  Surgeon: Patton Salles, MD;  Location: [facility];  Service: Gynecology;  Laterality: N/A;   BLADDER SUSPENSION N/A 11/22/2016   Procedure: TRANSVAGINAL TAPE (TVT) PROCEDURE exact mid urethral sling;  Surgeon: Patton Salles, MD;  Location: [facility];  Service: Gynecology;  Laterality: N/A;   BUNIONECTOMY     CYSTOSCOPY N/A 11/22/2016   Procedure: CYSTOSCOPY;  Surgeon: Patton Salles, MD;  Location: [facility];  Service: Gynecology;  Laterality: N/A;   TONSILLECTOMY      Allergies  Allergen Reactions   Nsaids Itching    Not certain which NSAID she developed a rash and itching to.   Diclofenac Itching   Ibuprofen Itching     Physical Exam: General: The patient is alert and oriented x3 in no acute distress.  Dermatology: Skin is warm, dry and supple bilateral lower extremities.   Vascular: Palpable pedal pulses bilaterally. Capillary refill within normal limits.  No appreciable edema.  No erythema.  Neurological: Grossly intact via light touch  Musculoskeletal Exam: Hallux valgus deformity noted with degenerative changes throughout the foot.  Limited range of motion with tenderness and crepitus to palpation range of  motion to the MTPs of the foot bilateral.  Generalized foot pain  Radiographic Exam B/L feet 05/09/2023:  Hallux valgus deformity noted bilateral.  Degenerative changes noted to the MTPs of the foot bilateral.  Orthopedic hardware noted to the base of the first metatarsal as well as lesser metatarsals bilateral from prior surgery.  Assessment/Plan of Care: 1.  Recurrent hallux valgus bilateral 2.  History of prior bunionectomy and hammertoe surgery bilateral 3.  Generalized foot pain bilateral  -Patient evaluated.  X-rays reviewed -Prescription for Medrol Dosepak -No NSAIDs.  Patient only has 1 kidney -Recommend good supportive tennis shoes and sneakers.  Advise against going barefoot -Return to clinic as needed     Felecia Shelling, DPM [facility]  Dr. Felecia Shelling, DPM    [address] [place], [place] [address]                Office [phone]  Fax [phone]

## 2023-05-11 ENCOUNTER — Telehealth: Payer: Self-pay | Admitting: Podiatry

## 2023-05-11 ENCOUNTER — Other Ambulatory Visit: Payer: Self-pay

## 2023-05-11 MED ORDER — METHYLPREDNISOLONE 4 MG PO TBPK: ORAL_TABLET | ORAL | 0 refills | Status: AC

## 2023-05-11 NOTE — Telephone Encounter (Signed)
 Patient called about a prescription for medrol Dosepak. Her preferred pharmacy is the [facility] in [place] on [address]. Thank you.

## 2023-05-14 IMAGING — MG MM DIGITAL SCREENING BILAT W/ TOMO AND CAD
8 series · 8 of 24 positions shown · non-contrast
Comparison: Previous exam(s).

CLINICAL DATA: Screening.

EXAM:
DIGITAL SCREENING BILATERAL MAMMOGRAM WITH TOMOSYNTHESIS AND CAD
TECHNIQUE: Bilateral screening digital craniocaudal and mediolateral oblique
mammograms were obtained. Bilateral screening digital breast
tomosynthesis was performed. The images were evaluated with
computer-aided detection.

[L MLO synth-2D]
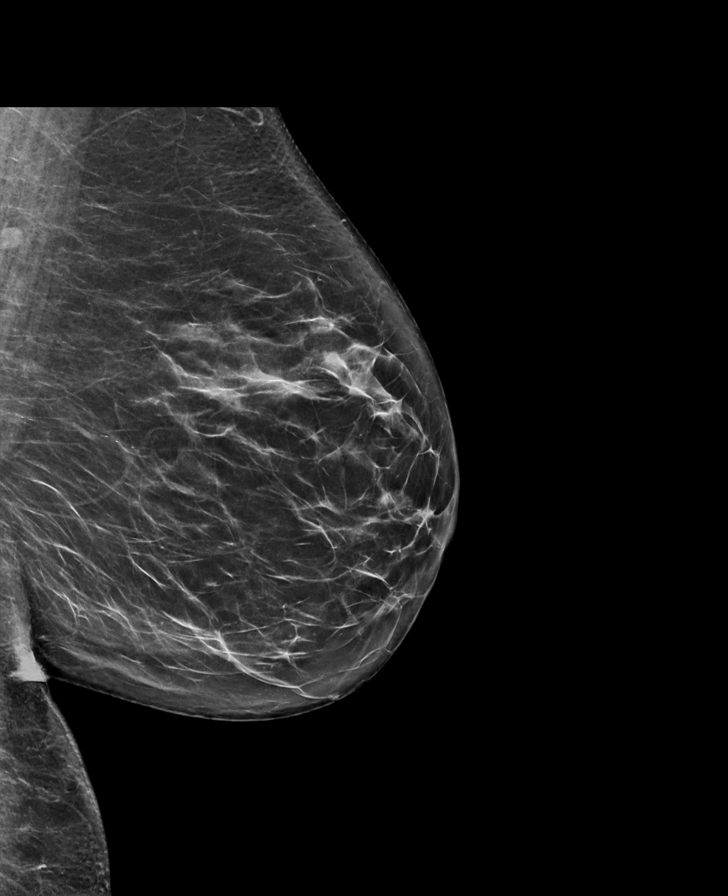

[L CC synth-2D]
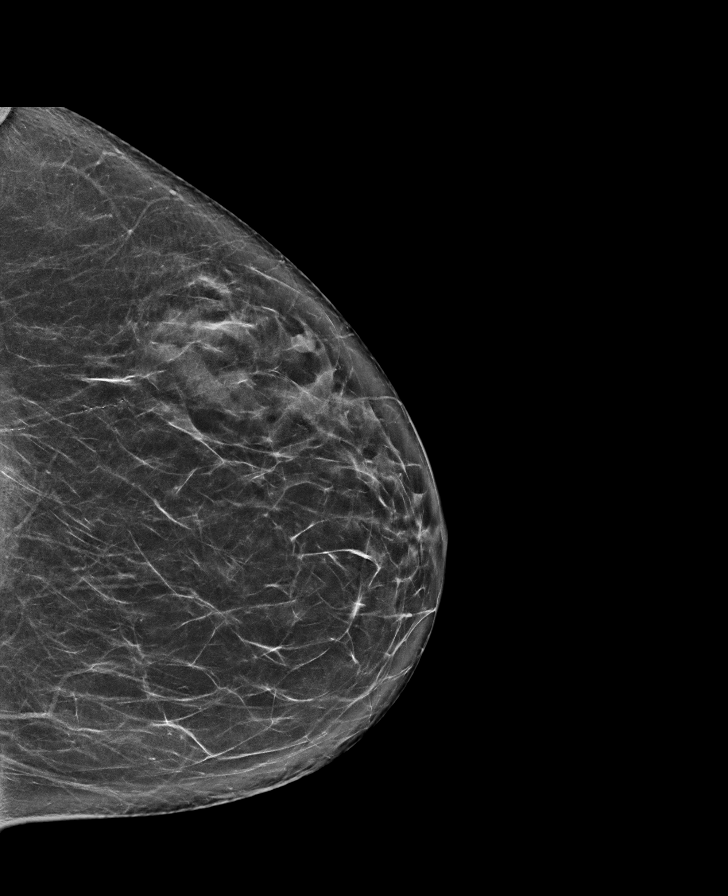

[R MLO synth-2D]
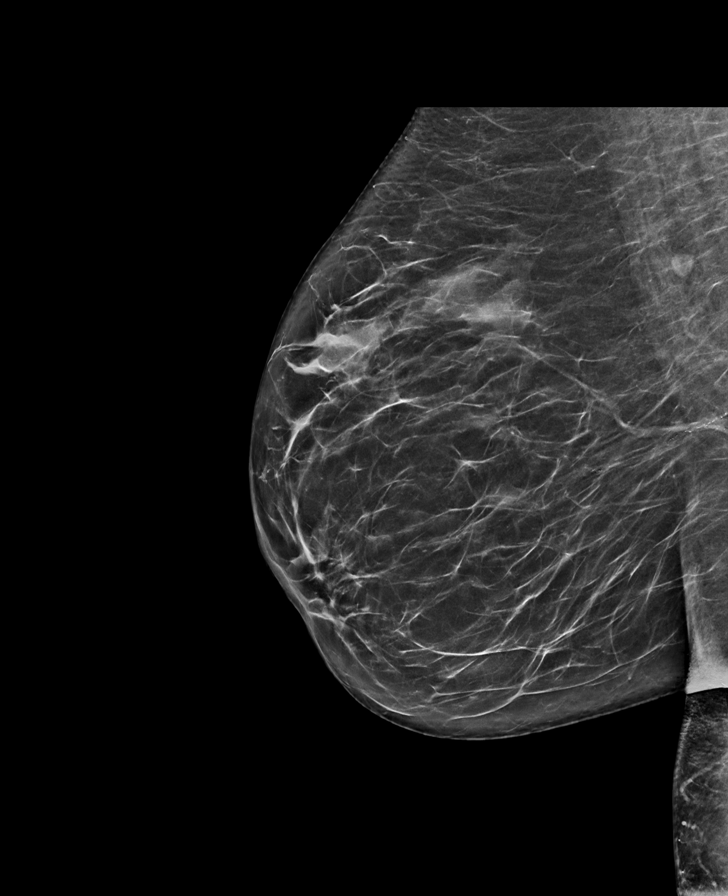

[R CC synth-2D]
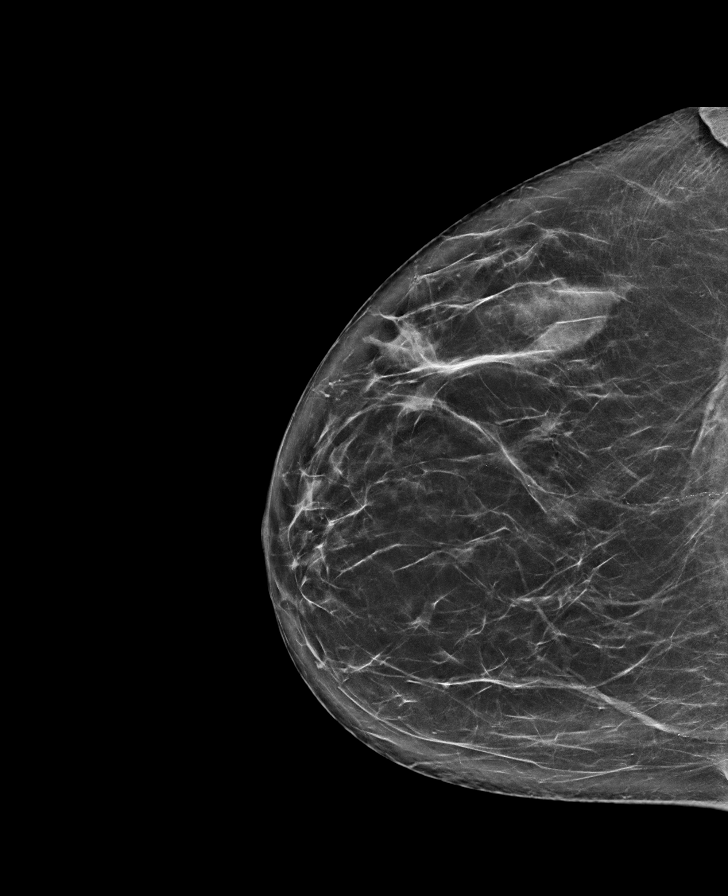

[R MLO tomo · tomo slice 38/75.0]
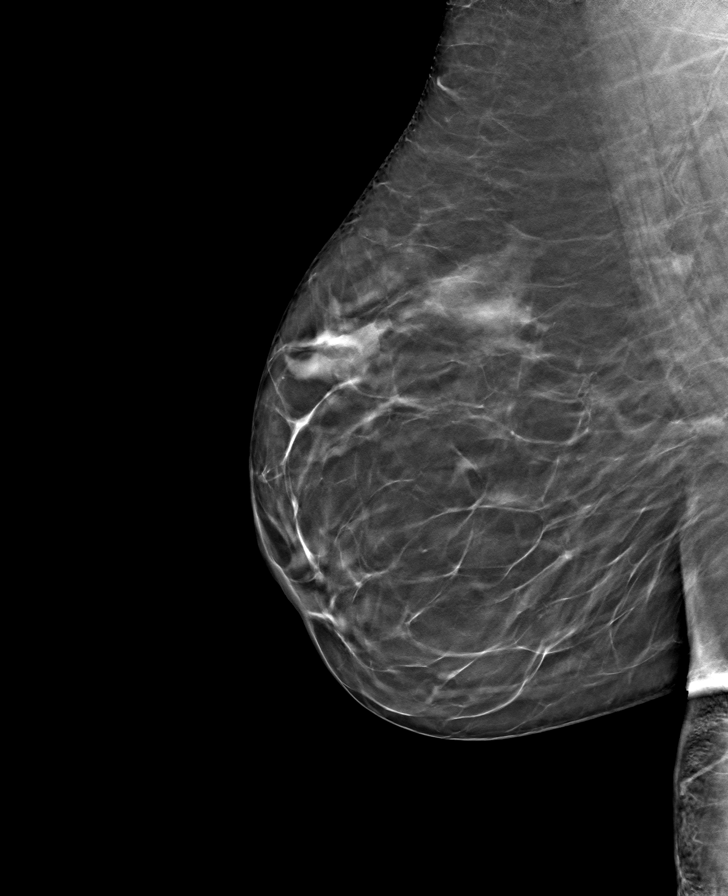

[R CC tomo · tomo slice 39/77.0]
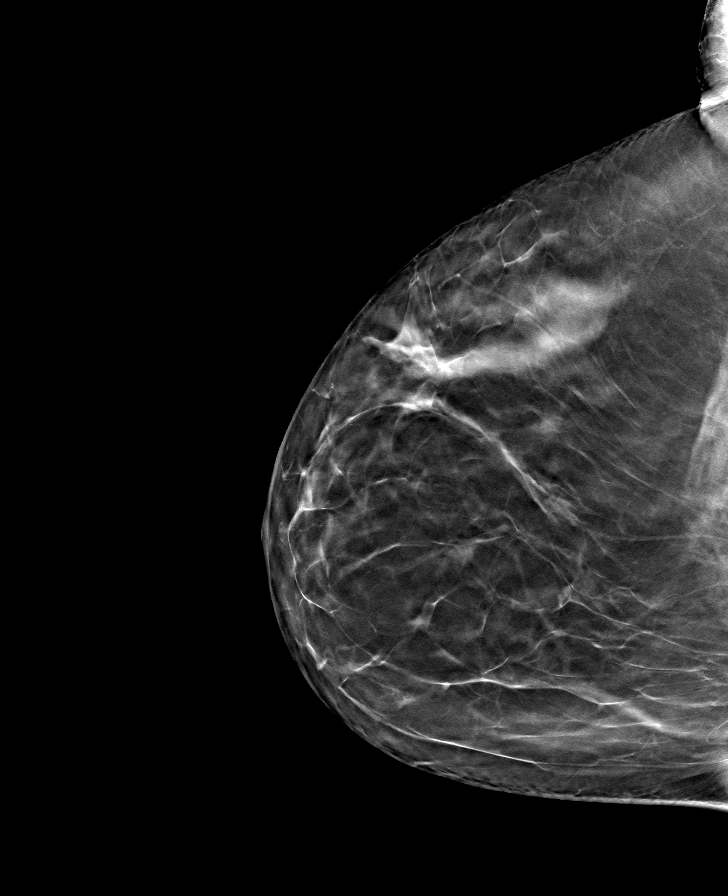

[L MLO tomo · tomo slice 39/77.0]
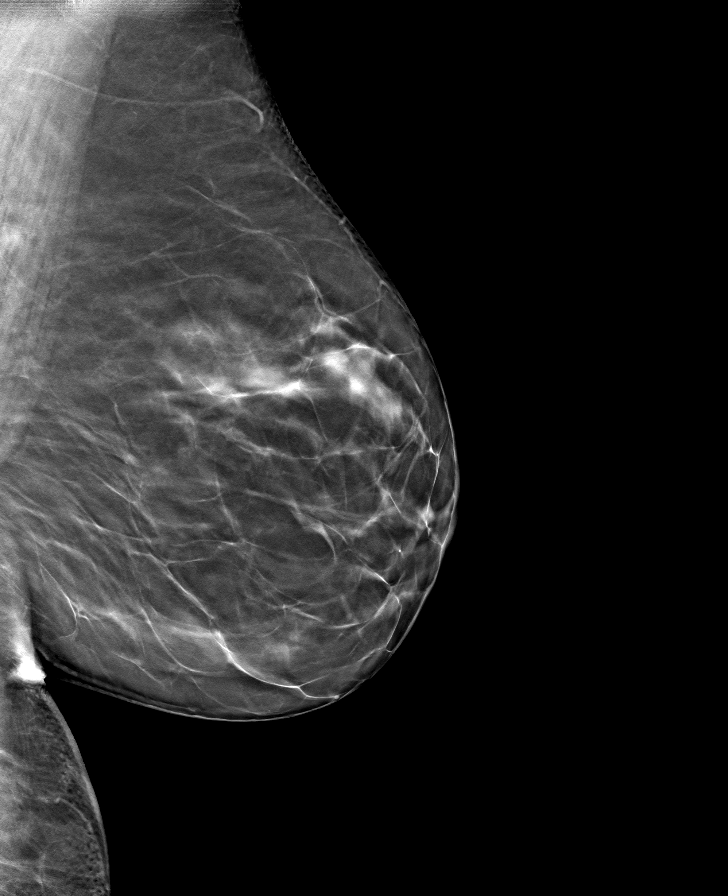

[L CC tomo · tomo slice 39/77.0]
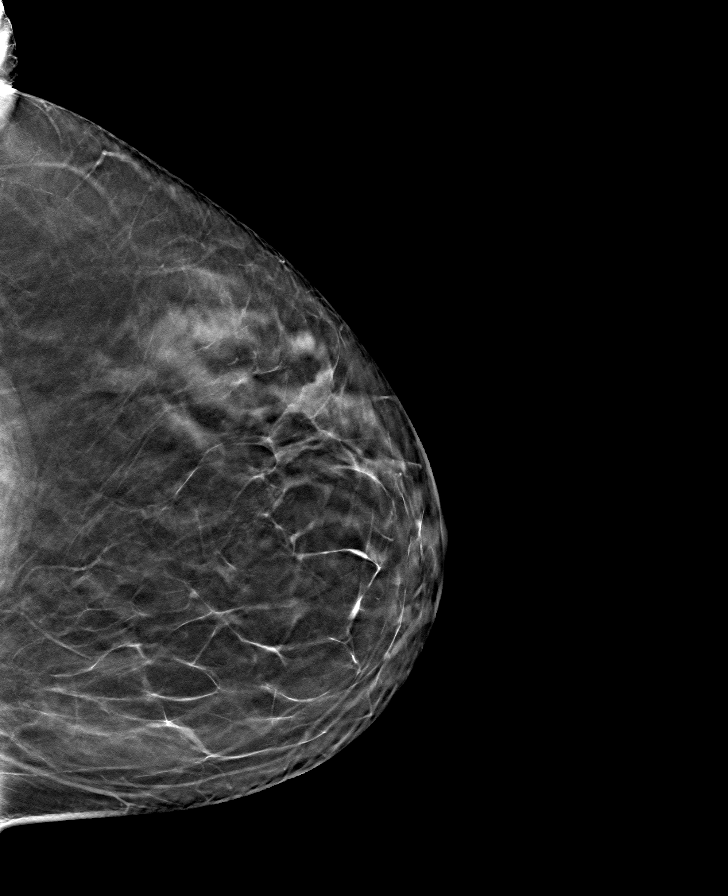

[8 of 24 positions shown; findings below may reference images not displayed]

ACR Breast Density Category b: There are scattered areas of
fibroglandular density.
FINDINGS: There are no findings suspicious for malignancy.
IMPRESSION: No mammographic evidence of malignancy. A result letter of this
screening mammogram will be mailed directly to the patient.

RECOMMENDATION:
Screening mammogram in one year. (Code:51-O-LD2)

BI-RADS CATEGORY  1: Negative.

## 2023-05-27 ENCOUNTER — Ambulatory Visit (INDEPENDENT_AMBULATORY_CARE_PROVIDER_SITE_OTHER)

## 2023-05-27 DIAGNOSIS — M2141 Flat foot [pes planus] (acquired), right foot: Secondary | ICD-10-CM | POA: Diagnosis not present

## 2023-05-27 DIAGNOSIS — M2142 Flat foot [pes planus] (acquired), left foot: Secondary | ICD-10-CM

## 2023-05-27 DIAGNOSIS — M19079 Primary osteoarthritis, unspecified ankle and foot: Secondary | ICD-10-CM

## 2023-05-27 NOTE — Progress Notes (Signed)
 Orthotics   Patient was present and evaluated for Custom molded foot orthotics. Patient will benefit from CFO's to provide total contact to BIL MLA's helping to balance and distribute body weight more evenly across BIL feet helping to reduce plantar pressure and pain. Orthotic will also encourage FF / RF alignment  Patient was scanned today and will return for fitting upon receipt  Addison Bailey Cped, CFo, CFm

## 2023-05-30 ENCOUNTER — Other Ambulatory Visit: Payer: Self-pay | Admitting: Cardiology

## 2023-06-01 DIAGNOSIS — N390 Urinary tract infection, site not specified: Secondary | ICD-10-CM | POA: Diagnosis not present

## 2023-06-01 DIAGNOSIS — R300 Dysuria: Secondary | ICD-10-CM | POA: Diagnosis not present

## 2023-06-06 ENCOUNTER — Ambulatory Visit: Admitting: Podiatry

## 2023-06-07 DIAGNOSIS — C642 Malignant neoplasm of left kidney, except renal pelvis: Secondary | ICD-10-CM | POA: Diagnosis not present

## 2023-06-07 DIAGNOSIS — Z08 Encounter for follow-up examination after completed treatment for malignant neoplasm: Secondary | ICD-10-CM | POA: Diagnosis not present

## 2023-06-07 DIAGNOSIS — Z79899 Other long term (current) drug therapy: Secondary | ICD-10-CM | POA: Diagnosis not present

## 2023-06-07 DIAGNOSIS — Z85528 Personal history of other malignant neoplasm of kidney: Secondary | ICD-10-CM | POA: Diagnosis not present

## 2023-06-08 ENCOUNTER — Ambulatory Visit: Admitting: Podiatry

## 2023-06-10 ENCOUNTER — Ambulatory Visit

## 2023-06-17 ENCOUNTER — Telehealth: Payer: Self-pay

## 2023-06-17 NOTE — Telephone Encounter (Signed)
 LVM to schedule orthotic PU

## 2023-06-30 ENCOUNTER — Ambulatory Visit

## 2023-06-30 ENCOUNTER — Other Ambulatory Visit

## 2023-06-30 NOTE — Progress Notes (Signed)
 Patient presents today to pick up custom molded foot orthotics, diagnosed with DJD of feet  by Dr. Luster Salters.   Orthotics were dispensed and fit was satisfactory. Reviewed instructions for break-in and wear. Written instructions given to patient.  Patient will follow up as needed.   Britton Cane Cped, CFo, CFm

## 2023-07-11 DIAGNOSIS — N1831 Chronic kidney disease, stage 3a: Secondary | ICD-10-CM | POA: Diagnosis not present

## 2023-07-11 DIAGNOSIS — M8588 Other specified disorders of bone density and structure, other site: Secondary | ICD-10-CM | POA: Diagnosis not present

## 2023-07-11 DIAGNOSIS — E7800 Pure hypercholesterolemia, unspecified: Secondary | ICD-10-CM | POA: Diagnosis not present

## 2023-07-11 DIAGNOSIS — I708 Atherosclerosis of other arteries: Secondary | ICD-10-CM | POA: Diagnosis not present

## 2023-07-11 DIAGNOSIS — N3090 Cystitis, unspecified without hematuria: Secondary | ICD-10-CM | POA: Diagnosis not present

## 2023-07-11 DIAGNOSIS — I10 Essential (primary) hypertension: Secondary | ICD-10-CM | POA: Diagnosis not present

## 2023-07-11 DIAGNOSIS — E039 Hypothyroidism, unspecified: Secondary | ICD-10-CM | POA: Diagnosis not present

## 2023-07-11 DIAGNOSIS — N951 Menopausal and female climacteric states: Secondary | ICD-10-CM | POA: Diagnosis not present

## 2023-07-11 DIAGNOSIS — Z85528 Personal history of other malignant neoplasm of kidney: Secondary | ICD-10-CM | POA: Diagnosis not present

## 2023-07-22 NOTE — Progress Notes (Signed)
 Gave power steps to hold over until COF's are in and when patient is back from vacation

## 2023-07-28 DIAGNOSIS — L821 Other seborrheic keratosis: Secondary | ICD-10-CM | POA: Diagnosis not present

## 2023-07-28 DIAGNOSIS — D225 Melanocytic nevi of trunk: Secondary | ICD-10-CM | POA: Diagnosis not present

## 2023-07-28 DIAGNOSIS — Z85828 Personal history of other malignant neoplasm of skin: Secondary | ICD-10-CM | POA: Diagnosis not present

## 2023-07-28 DIAGNOSIS — L814 Other melanin hyperpigmentation: Secondary | ICD-10-CM | POA: Diagnosis not present

## 2023-09-25 DIAGNOSIS — M545 Low back pain, unspecified: Secondary | ICD-10-CM | POA: Diagnosis not present

## 2023-09-25 DIAGNOSIS — I129 Hypertensive chronic kidney disease with stage 1 through stage 4 chronic kidney disease, or unspecified chronic kidney disease: Secondary | ICD-10-CM | POA: Diagnosis not present

## 2023-09-25 DIAGNOSIS — D6869 Other thrombophilia: Secondary | ICD-10-CM | POA: Diagnosis not present

## 2023-09-25 DIAGNOSIS — M1990 Unspecified osteoarthritis, unspecified site: Secondary | ICD-10-CM | POA: Diagnosis not present

## 2023-09-25 DIAGNOSIS — E039 Hypothyroidism, unspecified: Secondary | ICD-10-CM | POA: Diagnosis not present

## 2023-09-25 DIAGNOSIS — E785 Hyperlipidemia, unspecified: Secondary | ICD-10-CM | POA: Diagnosis not present

## 2023-09-25 DIAGNOSIS — N189 Chronic kidney disease, unspecified: Secondary | ICD-10-CM | POA: Diagnosis not present

## 2023-09-25 DIAGNOSIS — E669 Obesity, unspecified: Secondary | ICD-10-CM | POA: Diagnosis not present

## 2023-09-25 DIAGNOSIS — I4891 Unspecified atrial fibrillation: Secondary | ICD-10-CM | POA: Diagnosis not present

## 2023-09-27 ENCOUNTER — Telehealth: Payer: Self-pay | Admitting: Cardiology

## 2023-09-27 MED ORDER — LISINOPRIL 20 MG PO TABS: 20.0000 mg | ORAL_TABLET | Freq: Every day | ORAL | 1 refills | Status: AC

## 2023-09-27 NOTE — Telephone Encounter (Signed)
 Pt's medication was sent to pt's pharmacy as requested. Confirmation received.

## 2023-09-27 NOTE — Telephone Encounter (Signed)
*  STAT* If patient is at the pharmacy, call can be transferred to refill team.   1. Which medications need to be refilled? (please list name of each medication and dose if known) lisinopril  (ZESTRIL ) 20 MG tablet    2. Would you like to learn more about the convenience, safety, & potential cost savings by using the [facility]?    3. Are you open to using the [facility] (Type [facility].  ).   4. Which pharmacy/location (including street and city if local pharmacy) is medication to be sent to? [facility] [address] [place] - [address]    5. Do they need a 30 day or 90 day supply? 90 day

## 2023-09-30 ENCOUNTER — Other Ambulatory Visit: Payer: Self-pay | Admitting: Cardiology

## 2023-09-30 DIAGNOSIS — I493 Ventricular premature depolarization: Secondary | ICD-10-CM

## 2023-10-10 DIAGNOSIS — H25813 Combined forms of age-related cataract, bilateral: Secondary | ICD-10-CM | POA: Diagnosis not present

## 2023-10-10 DIAGNOSIS — H40033 Anatomical narrow angle, bilateral: Secondary | ICD-10-CM | POA: Diagnosis not present

## 2023-10-10 DIAGNOSIS — H04123 Dry eye syndrome of bilateral lacrimal glands: Secondary | ICD-10-CM | POA: Diagnosis not present

## 2023-10-10 DIAGNOSIS — H1045 Other chronic allergic conjunctivitis: Secondary | ICD-10-CM | POA: Diagnosis not present

## 2023-10-10 DIAGNOSIS — H524 Presbyopia: Secondary | ICD-10-CM | POA: Diagnosis not present

## 2023-10-26 DIAGNOSIS — Z85528 Personal history of other malignant neoplasm of kidney: Secondary | ICD-10-CM | POA: Diagnosis not present

## 2023-10-26 DIAGNOSIS — Z23 Encounter for immunization: Secondary | ICD-10-CM | POA: Diagnosis not present

## 2023-10-26 DIAGNOSIS — R040 Epistaxis: Secondary | ICD-10-CM | POA: Diagnosis not present

## 2023-10-26 DIAGNOSIS — R0989 Other specified symptoms and signs involving the circulatory and respiratory systems: Secondary | ICD-10-CM | POA: Diagnosis not present

## 2023-11-25 ENCOUNTER — Telehealth: Payer: Self-pay | Admitting: Pharmacist

## 2023-11-25 NOTE — Progress Notes (Signed)
   11/25/2023  Patient ID: Kellie Case, female   DOB: 12-08-1955, 68 y.o.   MRN: [id number]  Patient appeared on med adherence report for 2025:  Rosuvastatin - need 241 covered/ 180 currently- can't miss more than 7 days Lisinopril - need 246 covered/ 270 currently= pass    Needs refill of Rosuvastatin  from Cardiology ASAP Refill request sent to the CV pool   Aloysius Lewis, PharmD, [facility] Phone Number: [phone]

## 2023-11-28 MED ORDER — ROSUVASTATIN CALCIUM 10 MG PO TABS: 10.0000 mg | ORAL_TABLET | ORAL | 3 refills | Status: AC

## 2023-11-28 NOTE — Telephone Encounter (Signed)
 Per fill hx. This is being rx by her PCP. She started taking it every other day but the rx was for daily, that is why it looks like she wasn't compliant. Her most recent rx she filled was for every other day. I can send in new rx.

## 2023-12-28 DIAGNOSIS — R7303 Prediabetes: Secondary | ICD-10-CM | POA: Diagnosis not present

## 2023-12-28 DIAGNOSIS — N1831 Chronic kidney disease, stage 3a: Secondary | ICD-10-CM | POA: Diagnosis not present

## 2023-12-28 DIAGNOSIS — M8588 Other specified disorders of bone density and structure, other site: Secondary | ICD-10-CM | POA: Diagnosis not present

## 2023-12-28 DIAGNOSIS — I10 Essential (primary) hypertension: Secondary | ICD-10-CM | POA: Diagnosis not present

## 2023-12-28 DIAGNOSIS — Z1331 Encounter for screening for depression: Secondary | ICD-10-CM | POA: Diagnosis not present

## 2023-12-28 DIAGNOSIS — E7800 Pure hypercholesterolemia, unspecified: Secondary | ICD-10-CM | POA: Diagnosis not present

## 2023-12-28 DIAGNOSIS — E039 Hypothyroidism, unspecified: Secondary | ICD-10-CM | POA: Diagnosis not present

## 2023-12-28 DIAGNOSIS — Z0000 Encounter for general adult medical examination without abnormal findings: Secondary | ICD-10-CM | POA: Diagnosis not present

## 2023-12-28 DIAGNOSIS — N3090 Cystitis, unspecified without hematuria: Secondary | ICD-10-CM | POA: Diagnosis not present

## 2024-05-01 ENCOUNTER — Ambulatory Visit: Admitting: Cardiology
